# Patient Record
Sex: Female | Born: 1965 | Race: Black or African American | Hispanic: No | Marital: Married | Smoking: Current every day smoker
Health system: Southern US, Community
[De-identification: ages and names within clinical notes are randomized; demographics above are authoritative.]

## PROBLEM LIST (undated history)

## (undated) DIAGNOSIS — I10 Essential (primary) hypertension: Secondary | ICD-10-CM

## (undated) HISTORY — PX: ECTOPIC PREGNANCY SURGERY: SHX613

## (undated) HISTORY — PX: OTHER SURGICAL HISTORY: SHX169

---

## 1998-04-22 ENCOUNTER — Encounter: Payer: Self-pay | Admitting: Obstetrics & Gynecology

## 1998-04-22 ENCOUNTER — Inpatient Hospital Stay (HOSPITAL_COMMUNITY): Admission: AD | Admit: 1998-04-22 | Discharge: 1998-04-22 | Payer: Self-pay | Admitting: Obstetrics & Gynecology

## 1998-04-26 ENCOUNTER — Ambulatory Visit (HOSPITAL_COMMUNITY): Admission: RE | Admit: 1998-04-26 | Discharge: 1998-04-26 | Payer: Self-pay | Admitting: Obstetrics

## 1998-04-29 ENCOUNTER — Ambulatory Visit (HOSPITAL_COMMUNITY): Admission: RE | Admit: 1998-04-29 | Discharge: 1998-04-29 | Payer: Self-pay | Admitting: *Deleted

## 1998-05-04 ENCOUNTER — Inpatient Hospital Stay (HOSPITAL_COMMUNITY): Admission: AD | Admit: 1998-05-04 | Discharge: 1998-05-04 | Payer: Self-pay | Admitting: Obstetrics & Gynecology

## 1998-05-29 ENCOUNTER — Other Ambulatory Visit: Admission: RE | Admit: 1998-05-29 | Discharge: 1998-05-29 | Payer: Self-pay | Admitting: Obstetrics and Gynecology

## 2004-04-18 ENCOUNTER — Encounter: Admission: RE | Admit: 2004-04-18 | Discharge: 2004-04-18 | Payer: Self-pay | Admitting: Family Medicine

## 2004-04-23 ENCOUNTER — Encounter: Admission: RE | Admit: 2004-04-23 | Discharge: 2004-04-23 | Payer: Self-pay | Admitting: Neurology

## 2004-06-05 ENCOUNTER — Encounter: Admission: RE | Admit: 2004-06-05 | Discharge: 2004-09-03 | Payer: Self-pay | Admitting: Family Medicine

## 2005-07-09 ENCOUNTER — Emergency Department (HOSPITAL_COMMUNITY): Admission: EM | Admit: 2005-07-09 | Discharge: 2005-07-09 | Payer: Self-pay | Admitting: Family Medicine

## 2006-10-27 ENCOUNTER — Emergency Department (HOSPITAL_COMMUNITY): Admission: EM | Admit: 2006-10-27 | Discharge: 2006-10-27 | Payer: Self-pay | Admitting: Emergency Medicine

## 2007-07-14 ENCOUNTER — Inpatient Hospital Stay (HOSPITAL_COMMUNITY): Admission: AD | Admit: 2007-07-14 | Discharge: 2007-07-14 | Payer: Self-pay | Admitting: Obstetrics & Gynecology

## 2007-07-17 ENCOUNTER — Ambulatory Visit (HOSPITAL_COMMUNITY): Admission: AD | Admit: 2007-07-17 | Discharge: 2007-07-17 | Payer: Self-pay | Admitting: Obstetrics & Gynecology

## 2007-07-17 ENCOUNTER — Encounter: Payer: Self-pay | Admitting: Obstetrics & Gynecology

## 2007-07-17 ENCOUNTER — Ambulatory Visit: Payer: Self-pay | Admitting: Obstetrics & Gynecology

## 2007-08-05 ENCOUNTER — Ambulatory Visit: Payer: Self-pay | Admitting: Obstetrics and Gynecology

## 2008-02-02 ENCOUNTER — Encounter: Payer: Self-pay | Admitting: Gynecology

## 2008-02-02 ENCOUNTER — Ambulatory Visit: Payer: Self-pay | Admitting: Gynecology

## 2008-02-02 ENCOUNTER — Other Ambulatory Visit: Admission: RE | Admit: 2008-02-02 | Discharge: 2008-02-02 | Payer: Self-pay | Admitting: Gynecology

## 2009-07-24 ENCOUNTER — Encounter: Admission: RE | Admit: 2009-07-24 | Discharge: 2009-07-24 | Payer: Self-pay | Admitting: Family Medicine

## 2010-03-07 ENCOUNTER — Ambulatory Visit: Admit: 2010-03-07 | Payer: Self-pay | Admitting: Gynecology

## 2010-07-03 NOTE — Op Note (Signed)
Alejandra Sloan, ANDREAS NO.:  [id number]   MEDICAL RECORD NO.:  [id number]          PATIENT TYPE:  AMB   LOCATION:  [facility]                           FACILITY:  [facility]   PHYSICIAN:  Lesly Dukes, M.D. DATE OF BIRTH:  March 15, 1965   DATE OF PROCEDURE:  07/17/2007  DATE OF DISCHARGE:                               OPERATIVE REPORT   PREOPERATIVE DIAGNOSIS:  A 45 year old female with left ectopic  pregnancy and pain.   POSTOPERATIVE DIAGNOSIS:  A 45 year old female with left ectopic  pregnancy and pain.   PROCEDURE:  Laparoscopic left salpingectomy and removal of ectopic.   SURGEON:  Lesly Dukes, M.D.   ANESTHESIA:  General.   FINDINGS:  A 5-cm left ectopic pregnancy.  No blood in the pelvis.   SPECIMENS:  Left fallopian tube and pregnancy to pathology.   EBL:  Minimal.   COMPLICATIONS:  None   PROCEDURE:  After informed consent was obtained, the patient was taken  to the operating room where general anesthesia was induced.  The patient  was placed in dorsal lithotomy position, and prepared and draped in  normal sterile fashion.  The bladder was emptied with a catheter.  A  Hulka clip was placed on the cervix.  An infraumbilical skin incision  was made with a scalpel, and carried down to the fascia.  The fascia was  incised and extended bluntly.  The peritoneum was entered bluntly.  A  Hasson trocar was introduced and held in place with 0-Vicryl sutures.   Pneumoperitoneum was achieved with CO2.  The laparoscope was introduced.  Two 5 mm ports were placed on the right side of the abdomen under direct  visualization.  Using a blunt grasper, the left fallopian tube and  pregnancy was grasped.  Using the Gyrus cautery and sharp dissection in  a stepwise fashion, the left fallopian tube was removed from its blood  supply and freed up so that it could be from the abdomen.  We changed to  5 mm scope.  An Endopouch was used to remove the pregnancy through 10 mm  port at the umbilicus.  We switched back to the 10 mm scope, and there  was good hemostasis noted at the end of the procedure.  There was also  copious adhesions on the right fallopian tube.  The patient needs to use  birth control to prevent recurrence ectopic pregnancies given that she  has had several in the past.   The pneumoperitoneum was released, and the laparoscope and Hasson trocar  removed as a single unit.  The fascia at the umbilicus was closed with 0  Vicryl in a running fashion.  The skin of all ports were closed with 4-0  Vicryl in a  subcuticular fashion.  Dermabond was placed in the 5 mm port as well.  There was good hemostasis at the end of the procedure.  The Hulka clip  was removed from the cervix.  The patient tolerated the procedure well.  The sponge, lap, instrument, and needle count were correct x2.  The  patient went to recovery room  in stable condition.      Lesly Dukes, M.D.  Electronically Signed     KHL/MEDQ  D:  07/17/2007  T:  07/18/2007  Job:  [id number]

## 2010-11-14 LAB — CBC
MCHC: 33.4
MCV: 84.7
Platelets: 310
WBC: 8.8

## 2010-11-14 LAB — URINALYSIS, ROUTINE W REFLEX MICROSCOPIC
Bilirubin Urine: NEGATIVE
Hgb urine dipstick: NEGATIVE
Nitrite: NEGATIVE
Protein, ur: NEGATIVE
Urobilinogen, UA: 0.2

## 2010-11-14 LAB — HCG, QUANTITATIVE, PREGNANCY
hCG, Beta Chain, Quant, S: 10174 — ABNORMAL HIGH
hCG, Beta Chain, Quant, S: 8952 — ABNORMAL HIGH

## 2010-11-14 LAB — ABO/RH: ABO/RH(D): O POS

## 2011-09-05 ENCOUNTER — Emergency Department (HOSPITAL_COMMUNITY): Payer: 59

## 2011-09-05 ENCOUNTER — Emergency Department (HOSPITAL_COMMUNITY)
Admission: EM | Admit: 2011-09-05 | Discharge: 2011-09-05 | Disposition: A | Discharge status: Home / Self Care | Payer: 59 | Attending: Emergency Medicine | Admitting: Emergency Medicine

## 2011-09-05 ENCOUNTER — Encounter (HOSPITAL_COMMUNITY): Payer: Self-pay | Admitting: Emergency Medicine

## 2011-09-05 DIAGNOSIS — M549 Dorsalgia, unspecified: Secondary | ICD-10-CM | POA: Diagnosis present

## 2011-09-05 DIAGNOSIS — Y92410 Unspecified street and highway as the place of occurrence of the external cause: Secondary | ICD-10-CM | POA: Insufficient documentation

## 2011-09-05 DIAGNOSIS — S20219A Contusion of unspecified front wall of thorax, initial encounter: Secondary | ICD-10-CM

## 2011-09-05 DIAGNOSIS — M25569 Pain in unspecified knee: Secondary | ICD-10-CM | POA: Diagnosis not present

## 2011-09-05 DIAGNOSIS — S76019A Strain of muscle, fascia and tendon of unspecified hip, initial encounter: Secondary | ICD-10-CM

## 2011-09-05 DIAGNOSIS — IMO0002 Reserved for concepts with insufficient information to code with codable children: Secondary | ICD-10-CM | POA: Insufficient documentation

## 2011-09-05 DIAGNOSIS — M542 Cervicalgia: Secondary | ICD-10-CM | POA: Diagnosis not present

## 2011-09-05 DIAGNOSIS — F17200 Nicotine dependence, unspecified, uncomplicated: Secondary | ICD-10-CM | POA: Diagnosis not present

## 2011-09-05 DIAGNOSIS — I10 Essential (primary) hypertension: Secondary | ICD-10-CM | POA: Insufficient documentation

## 2011-09-05 DIAGNOSIS — K769 Liver disease, unspecified: Secondary | ICD-10-CM | POA: Insufficient documentation

## 2011-09-05 DIAGNOSIS — R160 Hepatomegaly, not elsewhere classified: Secondary | ICD-10-CM

## 2011-09-05 HISTORY — DX: Essential (primary) hypertension: I10

## 2011-09-05 LAB — CBC WITH DIFFERENTIAL/PLATELET
HCT: 38.6 % (ref 36.0–46.0)
Hemoglobin: 13 g/dL (ref 12.0–15.0)
Lymphocytes Relative: 32 % (ref 12–46)
Lymphs Abs: 2 10*3/uL (ref 0.7–4.0)
Monocytes Absolute: 0.5 10*3/uL (ref 0.1–1.0)
Monocytes Relative: 8 % (ref 3–12)
Neutro Abs: 3.4 10*3/uL (ref 1.7–7.7)
WBC: 6.3 10*3/uL (ref 4.0–10.5)

## 2011-09-05 LAB — POCT I-STAT, CHEM 8
BUN: 9 mg/dL (ref 6–23)
Calcium, Ion: 1.19 mmol/L (ref 1.12–1.23)
Chloride: 103 mEq/L (ref 96–112)
Creatinine, Ser: 1 mg/dL (ref 0.50–1.10)
Glucose, Bld: 88 mg/dL (ref 70–99)

## 2011-09-05 MED ORDER — ONDANSETRON HCL 4 MG/2ML IJ SOLN
4.0000 mg | Freq: Once | INTRAMUSCULAR | Status: AC
  Administered 2011-09-05: 4 mg via INTRAVENOUS
  Filled 2011-09-05: qty 2

## 2011-09-05 MED ORDER — IBUPROFEN 800 MG PO TABS: 800.0000 mg | ORAL_TABLET | Freq: Three times a day (TID) | ORAL | Status: AC

## 2011-09-05 MED ORDER — HYDROCODONE-ACETAMINOPHEN 5-325 MG PO TABS: 2.0000 | ORAL_TABLET | ORAL | Status: AC | PRN

## 2011-09-05 MED ORDER — HYDROMORPHONE HCL PF 1 MG/ML IJ SOLN
1.0000 mg | Freq: Once | INTRAMUSCULAR | Status: AC
  Administered 2011-09-05: 1 mg via INTRAVENOUS
  Filled 2011-09-05: qty 1

## 2011-09-05 MED ORDER — HYDROMORPHONE HCL PF 1 MG/ML IJ SOLN
0.5000 mg | Freq: Once | INTRAMUSCULAR | Status: DC
  Filled 2011-09-05: qty 1

## 2011-09-05 MED ORDER — DIAZEPAM 5 MG PO TABS
5.0000 mg | ORAL_TABLET | Freq: Once | ORAL | Status: AC
  Administered 2011-09-05: 5 mg via ORAL
  Filled 2011-09-05: qty 1

## 2011-09-05 MED ORDER — SODIUM CHLORIDE 0.9 % IV BOLUS (SEPSIS)
500.0000 mL | Freq: Once | INTRAVENOUS | Status: AC
  Administered 2011-09-05: 500 mL via INTRAVENOUS

## 2011-09-05 MED ORDER — DIAZEPAM 5 MG PO TABS: ORAL_TABLET | ORAL | Status: AC

## 2011-09-05 MED ORDER — HYDROMORPHONE HCL PF 1 MG/ML IJ SOLN
0.5000 mg | Freq: Once | INTRAMUSCULAR | Status: AC
  Administered 2011-09-05: 0.5 mg via INTRAVENOUS
  Filled 2011-09-05: qty 1

## 2011-09-05 MED ORDER — KETOROLAC TROMETHAMINE 30 MG/ML IJ SOLN
30.0000 mg | Freq: Once | INTRAMUSCULAR | Status: AC
  Administered 2011-09-05: 30 mg via INTRAVENOUS
  Filled 2011-09-05: qty 1

## 2011-09-05 MED ORDER — IOHEXOL 300 MG/ML  SOLN
80.0000 mL | Freq: Once | INTRAMUSCULAR | Status: AC | PRN
  Administered 2011-09-05: 80 mL via INTRAVENOUS

## 2011-09-05 NOTE — ED Notes (Signed)
Pt unable to provide urine sample at this time 
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
## 2011-09-05 NOTE — ED Notes (Signed)
Patient transported to X-ray 
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
## 2011-09-05 NOTE — ED Notes (Signed)
Pt discharged home with husband. Pt ambulated to discharge independently. A x 4. NAD

## 2011-09-05 NOTE — ED Notes (Addendum)
Pt transported to Xray. 

## 2011-09-06 NOTE — ED Provider Notes (Signed)
Medical screening examination/treatment/procedure(s) were performed by non-physician practitioner and as supervising physician I was immediately available for consultation/collaboration.   Celene Kras, MD 09/06/11 [phone]

## 2011-09-10 ENCOUNTER — Other Ambulatory Visit: Payer: Self-pay | Admitting: Family Medicine

## 2011-09-10 DIAGNOSIS — R109 Unspecified abdominal pain: Secondary | ICD-10-CM

## 2011-09-11 ENCOUNTER — Ambulatory Visit
Admission: RE | Admit: 2011-09-11 | Discharge: 2011-09-11 | Disposition: A | Discharge status: Home / Self Care | Payer: 59 | Source: Ambulatory Visit | Attending: Family Medicine | Admitting: Family Medicine

## 2011-09-11 DIAGNOSIS — R109 Unspecified abdominal pain: Secondary | ICD-10-CM

## 2011-09-11 MED ORDER — GADOXETATE DISODIUM 0.25 MMOL/ML IV SOLN
6.0000 mL | Freq: Once | INTRAVENOUS | Status: AC | PRN
  Administered 2011-09-11: 6 mL via INTRAVENOUS

## 2012-10-05 ENCOUNTER — Other Ambulatory Visit: Payer: Self-pay | Admitting: Orthopedic Surgery

## 2012-10-12 ENCOUNTER — Encounter (HOSPITAL_COMMUNITY): Payer: Self-pay | Admitting: Pharmacy Technician

## 2012-10-13 NOTE — Pre-Procedure Instructions (Signed)
JUDA LAJEUNESSE  10/13/2012   Your procedure is scheduled on:  10/21/2012   Report to [facility] at 7 AM.  Call this number if you have problems the morning of surgery: [phone]   Remember:   Do not eat food or drink liquids after midnight.   Take these medicines the morning of surgery with A SIP OF WATER: valium, plendil, hydrocodone   Do not wear jewelry, make-up or nail polish.  Do not wear lotions, powders, or perfumes. You may wear deodorant.  Do not shave 48 hours prior to surgery. Men may shave face and neck.  Do not bring valuables to the hospital.  [facility] is not responsible                   for any belongings or valuables.  Contacts, dentures or bridgework may not be worn into surgery.  Leave suitcase in the car. After surgery it may be brought to your room.  For patients admitted to the hospital, checkout time is 11:00 AM the day of  discharge.   Patients discharged the day of surgery will not be allowed to drive  home.  Name and phone number of your driver: family  Special Instructions: Incentive Spirometry - Practice and bring it with you on the day of surgery.   Please read over the following fact sheets that you were given: Pain Booklet, Coughing and Deep Breathing, Blood Transfusion Information, MRSA Information and Surgical Site Infection Prevention

## 2012-10-14 ENCOUNTER — Encounter (HOSPITAL_COMMUNITY): Payer: Self-pay

## 2012-10-14 ENCOUNTER — Encounter (HOSPITAL_COMMUNITY)
Admission: RE | Admit: 2012-10-14 | Discharge: 2012-10-14 | Disposition: A | Discharge status: Home / Self Care | Payer: 59 | Source: Ambulatory Visit | Attending: Orthopedic Surgery | Admitting: Orthopedic Surgery

## 2012-10-14 DIAGNOSIS — Z01818 Encounter for other preprocedural examination: Secondary | ICD-10-CM | POA: Insufficient documentation

## 2012-10-14 DIAGNOSIS — Z01810 Encounter for preprocedural cardiovascular examination: Secondary | ICD-10-CM | POA: Insufficient documentation

## 2012-10-14 DIAGNOSIS — Z01812 Encounter for preprocedural laboratory examination: Secondary | ICD-10-CM | POA: Insufficient documentation

## 2012-10-14 LAB — COMPREHENSIVE METABOLIC PANEL
Albumin: 3.8 g/dL (ref 3.5–5.2)
BUN: 9 mg/dL (ref 6–23)
Calcium: 9.1 mg/dL (ref 8.4–10.5)
Creatinine, Ser: 0.72 mg/dL (ref 0.50–1.10)
GFR calc Af Amer: >90 mL/min (ref >90)
Total Protein: 7.1 g/dL (ref 6.0–8.3)

## 2012-10-14 LAB — PROTIME-INR
INR: 1.01 (ref 0.00–1.49)
Prothrombin Time: 13.1 seconds (ref 11.6–15.2)

## 2012-10-14 LAB — TYPE AND SCREEN: Antibody Screen: NEGATIVE

## 2012-10-14 LAB — CBC WITH DIFFERENTIAL/PLATELET
Basophils Relative: 1 % (ref 0–1)
Eosinophils Absolute: 0.2 10*3/uL (ref 0.0–0.7)
Eosinophils Relative: 3 % (ref 0–5)
HCT: 39.8 % (ref 36.0–46.0)
Hemoglobin: 13 g/dL (ref 12.0–15.0)
MCH: 27.2 pg (ref 26.0–34.0)
MCHC: 32.7 g/dL (ref 30.0–36.0)
MCV: 83.3 fL (ref 78.0–100.0)
Monocytes Absolute: 0.5 10*3/uL (ref 0.1–1.0)
Monocytes Relative: 7 % (ref 3–12)
Neutrophils Relative %: 50 % (ref 43–77)

## 2012-10-14 LAB — URINALYSIS, ROUTINE W REFLEX MICROSCOPIC
Hgb urine dipstick: NEGATIVE
Ketones, ur: NEGATIVE mg/dL
Nitrite: NEGATIVE
Urobilinogen, UA: 0.2 mg/dL (ref 0.0–1.0)

## 2012-10-14 LAB — ABO/RH: ABO/RH(D): O POS

## 2012-10-14 LAB — SURGICAL PCR SCREEN: Staphylococcus aureus: NEGATIVE

## 2012-10-14 LAB — URINE MICROSCOPIC-ADD ON

## 2012-10-20 MED ORDER — CEFAZOLIN SODIUM-DEXTROSE 2-3 GM-% IV SOLR
2.0000 g | INTRAVENOUS | Status: AC
  Administered 2012-10-21: 2 g via INTRAVENOUS
  Filled 2012-10-20: qty 50

## 2012-10-21 ENCOUNTER — Encounter (HOSPITAL_COMMUNITY): Payer: Self-pay | Admitting: Anesthesiology

## 2012-10-21 ENCOUNTER — Ambulatory Visit (HOSPITAL_COMMUNITY)
Admission: RE | Admit: 2012-10-21 | Discharge: 2012-10-21 | Disposition: A | Discharge status: Home / Self Care | Payer: 59 | Source: Ambulatory Visit | Attending: Orthopedic Surgery | Admitting: Orthopedic Surgery

## 2012-10-21 ENCOUNTER — Encounter (HOSPITAL_COMMUNITY)
Admission: RE | Disposition: A | Discharge status: Home / Self Care | Payer: Self-pay | Source: Ambulatory Visit | Attending: Orthopedic Surgery

## 2012-10-21 ENCOUNTER — Ambulatory Visit (HOSPITAL_COMMUNITY): Payer: 59 | Admitting: Anesthesiology

## 2012-10-21 ENCOUNTER — Ambulatory Visit (HOSPITAL_COMMUNITY): Payer: 59

## 2012-10-21 ENCOUNTER — Encounter (HOSPITAL_COMMUNITY): Payer: Self-pay | Admitting: *Deleted

## 2012-10-21 DIAGNOSIS — M5126 Other intervertebral disc displacement, lumbar region: Secondary | ICD-10-CM | POA: Insufficient documentation

## 2012-10-21 DIAGNOSIS — I10 Essential (primary) hypertension: Secondary | ICD-10-CM | POA: Insufficient documentation

## 2012-10-21 DIAGNOSIS — Z79899 Other long term (current) drug therapy: Secondary | ICD-10-CM | POA: Insufficient documentation

## 2012-10-21 DIAGNOSIS — F17200 Nicotine dependence, unspecified, uncomplicated: Secondary | ICD-10-CM | POA: Insufficient documentation

## 2012-10-21 DIAGNOSIS — M5410 Radiculopathy, site unspecified: Secondary | ICD-10-CM

## 2012-10-21 HISTORY — PX: LUMBAR LAMINECTOMY/DECOMPRESSION MICRODISCECTOMY: SHX5026

## 2012-10-21 SURGERY — LUMBAR LAMINECTOMY/DECOMPRESSION MICRODISCECTOMY
Anesthesia: General | Site: Spine Lumbar | Laterality: Left | Wound class: Clean

## 2012-10-21 MED ORDER — METHYLPREDNISOLONE ACETATE 40 MG/ML IJ SUSP
INTRAMUSCULAR | Status: AC
  Filled 2012-10-21: qty 1

## 2012-10-21 MED ORDER — HYDROMORPHONE HCL PF 1 MG/ML IJ SOLN
INTRAMUSCULAR | Status: AC
  Filled 2012-10-21: qty 1

## 2012-10-21 MED ORDER — INDIGOTINDISULFONATE SODIUM 8 MG/ML IJ SOLN
INTRAMUSCULAR | Status: AC
  Filled 2012-10-21: qty 5

## 2012-10-21 MED ORDER — ROCURONIUM BROMIDE 100 MG/10ML IV SOLN
INTRAVENOUS | Status: DC | PRN
  Administered 2012-10-21: 50 mg via INTRAVENOUS

## 2012-10-21 MED ORDER — NEOSTIGMINE METHYLSULFATE 1 MG/ML IJ SOLN
INTRAMUSCULAR | Status: DC | PRN
  Administered 2012-10-21: 3 mg via INTRAVENOUS

## 2012-10-21 MED ORDER — LACTATED RINGERS IV SOLN
INTRAVENOUS | Status: DC
  Administered 2012-10-21 (×2): via INTRAVENOUS

## 2012-10-21 MED ORDER — ONDANSETRON HCL 4 MG/2ML IJ SOLN
INTRAMUSCULAR | Status: DC | PRN
  Administered 2012-10-21: 4 mg via INTRAVENOUS

## 2012-10-21 MED ORDER — POVIDONE-IODINE 7.5 % EX SOLN
Freq: Once | CUTANEOUS | Status: DC
  Filled 2012-10-21: qty 118

## 2012-10-21 MED ORDER — PHENYLEPHRINE HCL 10 MG/ML IJ SOLN
INTRAMUSCULAR | Status: DC | PRN
  Administered 2012-10-21: 40 ug via INTRAVENOUS
  Administered 2012-10-21: 80 ug via INTRAVENOUS
  Administered 2012-10-21: 40 ug via INTRAVENOUS
  Administered 2012-10-21: 80 ug via INTRAVENOUS

## 2012-10-21 MED ORDER — FENTANYL CITRATE 0.05 MG/ML IJ SOLN
INTRAMUSCULAR | Status: DC | PRN
  Administered 2012-10-21: 150 ug via INTRAVENOUS
  Administered 2012-10-21: 50 ug via INTRAVENOUS

## 2012-10-21 MED ORDER — METHYLPREDNISOLONE ACETATE 40 MG/ML IJ SUSP
INTRAMUSCULAR | Status: DC | PRN
  Administered 2012-10-21: 40 mg via INTRA_ARTICULAR

## 2012-10-21 MED ORDER — HYDROMORPHONE HCL PF 1 MG/ML IJ SOLN
0.2500 mg | INTRAMUSCULAR | Status: DC | PRN
  Administered 2012-10-21 (×3): 0.5 mg via INTRAVENOUS

## 2012-10-21 MED ORDER — PROPOFOL 10 MG/ML IV BOLUS
INTRAVENOUS | Status: DC | PRN
  Administered 2012-10-21: 180 mg via INTRAVENOUS

## 2012-10-21 MED ORDER — OXYCODONE-ACETAMINOPHEN 5-325 MG PO TABS
ORAL_TABLET | ORAL | Status: AC
  Filled 2012-10-21: qty 1

## 2012-10-21 MED ORDER — MIDAZOLAM HCL 5 MG/5ML IJ SOLN
INTRAMUSCULAR | Status: DC | PRN
  Administered 2012-10-21: 2 mg via INTRAVENOUS

## 2012-10-21 MED ORDER — GLYCOPYRROLATE 0.2 MG/ML IJ SOLN
INTRAMUSCULAR | Status: DC | PRN
  Administered 2012-10-21: 0.6 mg via INTRAVENOUS

## 2012-10-21 MED ORDER — BUPIVACAINE-EPINEPHRINE PF 0.25-1:200000 % IJ SOLN
INTRAMUSCULAR | Status: AC
  Filled 2012-10-21: qty 30

## 2012-10-21 MED ORDER — THROMBIN 20000 UNITS EX SOLR
CUTANEOUS | Status: AC
  Filled 2012-10-21: qty 20000

## 2012-10-21 MED ORDER — INDIGOTINDISULFONATE SODIUM 8 MG/ML IJ SOLN
INTRAMUSCULAR | Status: DC | PRN
  Administered 2012-10-21: .2 mL via INTRAVENOUS

## 2012-10-21 MED ORDER — LIDOCAINE HCL (CARDIAC) 20 MG/ML IV SOLN
INTRAVENOUS | Status: DC | PRN
  Administered 2012-10-21: 70 mg via INTRAVENOUS

## 2012-10-21 MED ORDER — OXYCODONE-ACETAMINOPHEN 5-325 MG PO TABS
2.0000 | ORAL_TABLET | Freq: Once | ORAL | Status: AC
  Administered 2012-10-21: 1 via ORAL

## 2012-10-21 MED ORDER — BUPIVACAINE-EPINEPHRINE 0.25% -1:200000 IJ SOLN
INTRAMUSCULAR | Status: DC | PRN
  Administered 2012-10-21: 14 mL

## 2012-10-21 MED ORDER — SODIUM CHLORIDE 0.9 % IV SOLN
10.0000 mg | INTRAVENOUS | Status: DC | PRN
  Administered 2012-10-21: 15 ug/min via INTRAVENOUS

## 2012-10-21 MED ORDER — THROMBIN 20000 UNITS EX SOLR
CUTANEOUS | Status: DC | PRN
  Administered 2012-10-21: 11:00:00 via TOPICAL

## 2012-10-21 SURGICAL SUPPLY — 72 items
APL SKNCLS STERI-STRIP NONHPOA (GAUZE/BANDAGES/DRESSINGS) ×1
BENZOIN TINCTURE PRP APPL 2/3 (GAUZE/BANDAGES/DRESSINGS) ×1 IMPLANT
BUR ROUND PRECISION 4.0 (BURR) ×2 IMPLANT
CANISTER SUCTION 2500CC (MISCELLANEOUS) ×2 IMPLANT
CARTRIDGE OIL MAESTRO DRILL (MISCELLANEOUS) ×1 IMPLANT
CLOTH BEACON ORANGE TIMEOUT ST (SAFETY) ×2 IMPLANT
CLSR STERI-STRIP ANTIMIC 1/2X4 (GAUZE/BANDAGES/DRESSINGS) ×1 IMPLANT
CORDS BIPOLAR (ELECTRODE) ×2 IMPLANT
COVER SURGICAL LIGHT HANDLE (MISCELLANEOUS) ×2 IMPLANT
DIFFUSER DRILL AIR PNEUMATIC (MISCELLANEOUS) ×2 IMPLANT
DRAIN CHANNEL 15F RND FF W/TCR (WOUND CARE) IMPLANT
DRAPE POUCH INSTRU U-SHP 10X18 (DRAPES) ×4 IMPLANT
DRAPE SURG 17X23 STRL (DRAPES) ×8 IMPLANT
DURAPREP 26ML APPLICATOR (WOUND CARE) ×2 IMPLANT
ELECT BLADE 4.0 EZ CLEAN MEGAD (MISCELLANEOUS)
ELECT CAUTERY BLADE 6.4 (BLADE) ×2 IMPLANT
ELECT REM PT RETURN 9FT ADLT (ELECTROSURGICAL) ×2
ELECTRODE BLDE 4.0 EZ CLN MEGD (MISCELLANEOUS) IMPLANT
ELECTRODE REM PT RTRN 9FT ADLT (ELECTROSURGICAL) ×1 IMPLANT
EVACUATOR SILICONE 100CC (DRAIN) IMPLANT
FILTER STRAW FLUID ASPIR (MISCELLANEOUS) ×2 IMPLANT
GAUZE SPONGE 4X4 16PLY XRAY LF (GAUZE/BANDAGES/DRESSINGS) ×4 IMPLANT
GLOVE BIO SURGEON STRL SZ7 (GLOVE) ×2 IMPLANT
GLOVE BIO SURGEON STRL SZ8 (GLOVE) ×2 IMPLANT
GLOVE BIOGEL PI IND STRL 7.0 (GLOVE) ×1 IMPLANT
GLOVE BIOGEL PI IND STRL 8 (GLOVE) ×1 IMPLANT
GLOVE BIOGEL PI INDICATOR 7.0 (GLOVE) ×1
GLOVE BIOGEL PI INDICATOR 8 (GLOVE) ×1
GOWN STRL NON-REIN LRG LVL3 (GOWN DISPOSABLE) ×4 IMPLANT
GOWN STRL REIN XL XLG (GOWN DISPOSABLE) ×2 IMPLANT
IV CATH 14GX2 1/4 (CATHETERS) ×2 IMPLANT
KIT BASIN OR (CUSTOM PROCEDURE TRAY) ×2 IMPLANT
KIT POSITION SURG JACKSON T1 (MISCELLANEOUS) ×2 IMPLANT
KIT ROOM TURNOVER OR (KITS) ×2 IMPLANT
NDL 18GX1X1/2 (RX/OR ONLY) (NEEDLE) ×1 IMPLANT
NDL HYPO 25GX1X1/2 BEV (NEEDLE) ×1 IMPLANT
NDL SPNL 18GX3.5 QUINCKE PK (NEEDLE) ×2 IMPLANT
NEEDLE 18GX1X1/2 (RX/OR ONLY) (NEEDLE) ×2 IMPLANT
NEEDLE 22X1 1/2 (OR ONLY) (NEEDLE) IMPLANT
NEEDLE HYPO 25GX1X1/2 BEV (NEEDLE) ×2 IMPLANT
NEEDLE SPNL 18GX3.5 QUINCKE PK (NEEDLE) ×4 IMPLANT
NS IRRIG 1000ML POUR BTL (IV SOLUTION) ×2 IMPLANT
OIL CARTRIDGE MAESTRO DRILL (MISCELLANEOUS) ×2
PACK LAMINECTOMY ORTHO (CUSTOM PROCEDURE TRAY) ×2 IMPLANT
PACK UNIVERSAL I (CUSTOM PROCEDURE TRAY) ×2 IMPLANT
PAD ARMBOARD 7.5X6 YLW CONV (MISCELLANEOUS) ×4 IMPLANT
PATTIES SURGICAL .5 X.5 (GAUZE/BANDAGES/DRESSINGS) IMPLANT
PATTIES SURGICAL .5 X1 (DISPOSABLE) ×2 IMPLANT
SPONGE GAUZE 4X4 12PLY (GAUZE/BANDAGES/DRESSINGS) ×2 IMPLANT
SPONGE INTESTINAL PEANUT (DISPOSABLE) ×3 IMPLANT
SPONGE SURGIFOAM ABS GEL 100 (HEMOSTASIS) ×2 IMPLANT
STRIP CLOSURE SKIN 1/2X4 (GAUZE/BANDAGES/DRESSINGS) IMPLANT
SURGIFLO TRUKIT (HEMOSTASIS) IMPLANT
SUT MNCRL AB 4-0 PS2 18 (SUTURE) ×2 IMPLANT
SUT VIC AB 0 CT1 18XCR BRD 8 (SUTURE) IMPLANT
SUT VIC AB 0 CT1 27 (SUTURE)
SUT VIC AB 0 CT1 27XBRD ANBCTR (SUTURE) IMPLANT
SUT VIC AB 0 CT1 8-18 (SUTURE)
SUT VIC AB 1 CT1 18XCR BRD 8 (SUTURE) ×1 IMPLANT
SUT VIC AB 1 CT1 8-18 (SUTURE) ×2
SUT VIC AB 2-0 CT2 18 VCP726D (SUTURE) ×2 IMPLANT
SYR 20CC LL (SYRINGE) IMPLANT
SYR 50ML SLIP (SYRINGE) ×1 IMPLANT
SYR BULB IRRIGATION 50ML (SYRINGE) ×2 IMPLANT
SYR CONTROL 10ML LL (SYRINGE) ×4 IMPLANT
SYR TB 1ML 26GX3/8 SAFETY (SYRINGE) ×4 IMPLANT
SYR TB 1ML LUER SLIP (SYRINGE) ×4 IMPLANT
TAPE CLOTH SURG 4X10 WHT LF (GAUZE/BANDAGES/DRESSINGS) ×1 IMPLANT
TOWEL OR 17X24 6PK STRL BLUE (TOWEL DISPOSABLE) ×2 IMPLANT
TOWEL OR 17X26 10 PK STRL BLUE (TOWEL DISPOSABLE) ×2 IMPLANT
WATER STERILE IRR 1000ML POUR (IV SOLUTION) ×1 IMPLANT
YANKAUER SUCT BULB TIP NO VENT (SUCTIONS) ×2 IMPLANT

## 2012-10-21 NOTE — Op Note (Signed)
Alejandra Sloan, Alejandra Sloan NO.:  [id number]  MEDICAL RECORD NO.:  [id number]  LOCATION:  [facility]                         FACILITY:  [facility]  PHYSICIAN:  Estill Bamberg, MD      DATE OF BIRTH:  [birth year]  DATE OF PROCEDURE:  10/21/2012 DATE OF DISCHARGE:  10/21/2012                              OPERATIVE REPORT   PREOPERATIVE DIAGNOSES: 1. Left-sided L5-S1 disk protrusion causing compression of the     traversing left S1 nerve. 2. Left-sided S1 radiculopathy.  POSTOPERATIVE DIAGNOSES: 1. Left-sided L5-S1 disk protrusion causing compression of the     traversing left S1 nerve. 2. Left-sided S1 radiculopathy.  PROCEDURE:  Left-sided L5-S1 microdiskectomy.  SURGEON:  Estill Bamberg, MD  ASSISTANT:  Jason Coop, PA-C  ANESTHESIA:  General endotracheal anesthesia.  COMPLICATIONS:  None.  DISPOSITION:  Stable.  ESTIMATED BLOOD LOSS:  Minimal.  INDICATIONS FOR PROCEDURE:  Briefly, Kamyiah is a very pleasant 47 year old female, who did initially presented to me about 1 year ago with low back and left leg pain.  The patient did get better with conservative care.  She did ultimately return to me about a year later with recurrence of pain in her low back.  She did also have pain in her left leg.  He and in the distribution of the S1 nerve on the left.  An MRI did reveal left-sided paracentral disk protrusion contacting the traversing S1 nerve on the left.  A repeat MRI was obtained which did reveal an increase in the size of the herniated disk fragment, and compression of the traversing S1 nerve.  The patient did have conservative care and did continue to have pain.  We therefore did discuss the risks and benefits regarding proceeding with a left-sided microdiskectomy at L5-S1.  The patient did fully understand the risks and limitations of the procedure as outlined in my preoperative note.  OPERATIVE DETAILS:  On October 21, 2012, the patient was brought  to surgery and general endotracheal anesthesia was administered.  The patient was placed prone on a well-padded flat Jackson bed with spinal frame.  Antibiotics were given and a time-out procedure was performed. The back was prepped and draped in the usual sterile fashion.  I then made a 1-inch incision overlying the L5-S1 interspace.  The lamina of L5 and S1 were subperiosteally exposed.  A lateral intraoperative radiograph did confirm the appropriate operative level.  I then used a high-speed bur to remove the medial and inferior aspect of the L5 lamina.  The ligamentum flavum was identified and the lateral aspect on the left side was removed.  The traversing S1 nerve was identified and retracted medially.  A broad-based L5-S1 disk protrusion was noted beneath the nerve.  I did use a 15-blade knife to perform an oblique annulotomy just lateral to the normal resting position of the traversing S1 nerve.  I then used an up-biting and a straight micro pituitary to remove the protruded disk fragment.  At the termination of this portion of the procedure, the previously noted protrusion was diminished and there was no compression identified of the traversing S1 nerve.  At this point, I did control epidural bleeding  using bipolar electrocautery in addition to Gelfoam and thrombin.  The wound was then copiously irrigated.  A 40 mg of Depo-Medrol was infiltrated about the epidural space on the left side, in the region of the traversing S1 nerve.  I then closed the fascia using #1 Vicryl.  The subcutaneous layer was then closed using 0 Vicryl followed by 2-0 Vicryl and the skin was closed using 3-0 Monocryl.  Benzoin and Steri-Strips were then applied followed by sterile dressing.  All instrument counts were correct at the termination of the procedure.  Of note, Jason Coop was my assistant throughout the entirety of the procedure and did aid in essential retraction and suctioning  needed throughout the entirety of the surgery.     Estill Bamberg, MD     MD/MEDQ  D:  10/21/2012  T:  10/21/2012  Job:  [id number]  cc:   Magnus Sinning) Tenny Craw, M.D.

## 2012-10-21 NOTE — Anesthesia Procedure Notes (Signed)
Procedure Name: Intubation Date/Time: 10/21/2012 10:33 AM Performed by: Quentin Ore Pre-anesthesia Checklist: Patient identified, Emergency Drugs available, Suction available, Patient being monitored and Timeout performed Patient Re-evaluated:Patient Re-evaluated prior to inductionOxygen Delivery Method: Circle system utilized Preoxygenation: Pre-oxygenation with 100% oxygen Intubation Type: IV induction Ventilation: Mask ventilation without difficulty Laryngoscope Size: Mac and 3 Grade View: Grade II Tube size: 7.0 mm Number of attempts: 1 Airway Equipment and Method: Stylet and LTA kit utilized Placement Confirmation: ETT inserted through vocal cords under direct vision,  positive ETCO2 and breath sounds checked- equal and bilateral Secured at: 22 cm Tube secured with: Tape Dental Injury: Teeth and Oropharynx as per pre-operative assessment

## 2012-10-21 NOTE — Transfer of Care (Signed)
Immediate Anesthesia Transfer of Care Note  Patient: Alejandra Sloan  Procedure(s) Performed: Procedure(s) with comments: LUMBAR LAMINECTOMY/DECOMPRESSION MICRODISCECTOMY (Left) - Left sided lumbar 5-sacrum 1 microdisectomy  Patient Location: PACU  Anesthesia Type:General  Level of Consciousness: awake, alert  and oriented  Airway & Oxygen Therapy: Patient Spontanous Breathing and Patient connected to nasal cannula oxygen  Post-op Assessment: Report given to PACU RN, Post -op Vital signs reviewed and stable and Patient moving all extremities X 4  Post vital signs: Reviewed and stable  Complications: No apparent anesthesia complications

## 2012-10-21 NOTE — Anesthesia Postprocedure Evaluation (Signed)
  Anesthesia Post-op Note  Patient: Alejandra Sloan  Procedure(s) Performed: Procedure(s) with comments: LUMBAR LAMINECTOMY/DECOMPRESSION MICRODISCECTOMY (Left) - Left sided lumbar 5-sacrum 1 microdisectomy  Patient Location: PACU  Anesthesia Type:General  Level of Consciousness: awake  Airway and Oxygen Therapy: Patient Spontanous Breathing  Post-op Pain: mild  Post-op Assessment: Post-op Vital signs reviewed  Post-op Vital Signs: Reviewed  Complications: No apparent anesthesia complications

## 2012-10-21 NOTE — Preoperative (Signed)
Beta Blockers   Reason not to administer Beta Blockers:Not Applicable 

## 2012-10-21 NOTE — Progress Notes (Signed)
Report given to Janann August for care for lunch relief

## 2012-10-21 NOTE — Anesthesia Preprocedure Evaluation (Signed)
Anesthesia Evaluation   Patient awake    Airway Mallampati: II      Dental   Pulmonary neg pulmonary ROS,          Cardiovascular hypertension, Pt. on medications     Neuro/Psych    GI/Hepatic negative GI ROS, Neg liver ROS,   Endo/Other  negative endocrine ROS  Renal/GU negative Renal ROS     Musculoskeletal   Abdominal   Peds  Hematology   Anesthesia Other Findings   Reproductive/Obstetrics                           Anesthesia Physical Anesthesia Plan  ASA: II  Anesthesia Plan: General   Post-op Pain Management:    Induction: Intravenous  Airway Management Planned: Oral ETT  Additional Equipment:   Intra-op Plan:   Post-operative Plan: Extubation in OR  Informed Consent: I have reviewed the patients History and Physical, chart, labs and discussed the procedure including the risks, benefits and alternatives for the proposed anesthesia with the patient or authorized representative who has indicated his/her understanding and acceptance.   Dental advisory given  Plan Discussed with: CRNA, Anesthesiologist and Surgeon  Anesthesia Plan Comments:         Anesthesia Quick Evaluation

## 2012-10-21 NOTE — H&P (Signed)
PREOPERATIVE H&P  Chief Complaint: left leg pain  HPI: Alejandra Sloan is a 47 y.o. female who presents ongoing pain in the left leg x 1 year. Patient has failed conservative care x 1 year and continues to have pain. MRI = left 5/1 HNP.  Past Medical History  Diagnosis Date  . Hypertension    Past Surgical History  Procedure Laterality Date  . Right knee surgery    . Ectopic pregnancy surgery     History   Social History  . Marital Status: Married    Spouse Name: N/A    Number of Children: N/A  . Years of Education: N/A   Social History Main Topics  . Smoking status: Current Every Day Smoker  . Smokeless tobacco: Not on file  . Alcohol Use: Yes  . Drug Use: No  . Sexual Activity: Not on file   Other Topics Concern  . Not on file   Social History Narrative  . No narrative on file   No family history on file. No Known Allergies Prior to Admission medications   Medication Sig Start Date End Date Taking? Authorizing Provider  Cholecalciferol (VITAMIN D-3) 5000 UNITS TABS Take 5,000 Units by mouth daily.   Yes Historical Provider, MD  diazepam (VALIUM) 5 MG tablet Take 5 mg by mouth daily as needed for anxiety.   Yes Historical Provider, MD  diclofenac (FLECTOR) 1.3 % PTCH Place 1 patch onto the skin daily as needed (pain).   Yes Historical Provider, MD  etodolac (LODINE) 400 MG tablet Take 400 mg by mouth 2 (two) times daily.   Yes Historical Provider, MD  felodipine (PLENDIL) 10 MG 24 hr tablet Take 10 mg by mouth daily.   Yes Historical Provider, MD  Ferrous Sulfate 28 MG TABS Take 28 mg by mouth daily.   Yes Historical Provider, MD  HYDROcodone-acetaminophen (NORCO) 7.5-325 MG per tablet Take 1 tablet by mouth 2 (two) times daily as needed for pain.   Yes Historical Provider, MD  Multiple Vitamin (MULTIVITAMIN WITH MINERALS) TABS Take 1 tablet by mouth daily.   Yes Historical Provider, MD  Multiple Vitamins-Minerals (HAIR/SKIN/NAILS PO) Take 1,000 mcg by mouth daily.    Yes Historical Provider, MD  traMADol (ULTRAM) 50 MG tablet Take 50 mg by mouth 2 (two) times daily as needed for pain.   Yes Historical Provider, MD     All other systems have been reviewed and were otherwise negative with the exception of those mentioned in the HPI and as above.  Physical Exam: Filed Vitals:   10/21/12 0816  BP: 144/99  Pulse: 73  Temp: 98.7 F (37.1 C)  Resp: 18    General: Alert, no acute distress Cardiovascular: No pedal edema Respiratory: No cyanosis, no use of accessory musculature GI: No organomegaly, abdomen is soft and non-tender Skin: No lesions in the area of chief complaint Neurologic: Sensation intact distally Psychiatric: Patient is competent for consent with normal mood and affect Lymphatic: No axillary or cervical lymphadenopathy  MUSCULOSKELETAL: + SLR on left  Assessment/Plan: Left leg pain Plan for Procedure(s): LUMBAR LAMINECTOMY/DECOMPRESSION MICRODISCECTOMY left L5/S1   Alejandra Hero, MD 10/21/2012 8:25 AM

## 2012-10-21 NOTE — Progress Notes (Signed)
Elyse Hsu visited patient

## 2012-10-22 ENCOUNTER — Encounter (HOSPITAL_COMMUNITY): Payer: Self-pay | Admitting: Orthopedic Surgery
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
## 2017-06-02 ENCOUNTER — Emergency Department (HOSPITAL_COMMUNITY)
Admission: EM | Admit: 2017-06-02 | Discharge: 2017-06-02 | Disposition: A | Discharge status: Home / Self Care | Payer: 59 | Attending: Emergency Medicine | Admitting: Emergency Medicine

## 2017-06-02 ENCOUNTER — Encounter (HOSPITAL_COMMUNITY): Payer: Self-pay | Admitting: Emergency Medicine

## 2017-06-02 ENCOUNTER — Emergency Department (HOSPITAL_COMMUNITY): Payer: 59

## 2017-06-02 ENCOUNTER — Other Ambulatory Visit: Payer: Self-pay

## 2017-06-02 DIAGNOSIS — R079 Chest pain, unspecified: Secondary | ICD-10-CM | POA: Diagnosis not present

## 2017-06-02 DIAGNOSIS — R51 Headache: Secondary | ICD-10-CM | POA: Insufficient documentation

## 2017-06-02 DIAGNOSIS — Z5321 Procedure and treatment not carried out due to patient leaving prior to being seen by health care provider: Secondary | ICD-10-CM | POA: Insufficient documentation

## 2017-06-02 LAB — BASIC METABOLIC PANEL
ANION GAP: 10 (ref 5–15)
BUN: 5 mg/dL — ABNORMAL LOW (ref 6–20)
CALCIUM: 9.1 mg/dL (ref 8.9–10.3)
CO2: 22 mmol/L (ref 22–32)
CREATININE: 0.85 mg/dL (ref 0.44–1.00)
Chloride: 106 mmol/L (ref 101–111)
Glucose, Bld: 91 mg/dL (ref 65–99)
Potassium: 3.5 mmol/L (ref 3.5–5.1)
SODIUM: 138 mmol/L (ref 135–145)

## 2017-06-02 LAB — I-STAT TROPONIN, ED: TROPONIN I, POC: 0 ng/mL (ref 0.00–0.08)

## 2017-06-02 LAB — CBC
HCT: 39.5 % (ref 36.0–46.0)
HEMOGLOBIN: 12.9 g/dL (ref 12.0–15.0)
MCH: 27.6 pg (ref 26.0–34.0)
MCHC: 32.7 g/dL (ref 30.0–36.0)
MCV: 84.4 fL (ref 78.0–100.0)
PLATELETS: 374 10*3/uL (ref 150–400)
RBC: 4.68 MIL/uL (ref 3.87–5.11)
RDW: 15.3 % (ref 11.5–15.5)
WBC: 8.5 10*3/uL (ref 4.0–10.5)

## 2017-06-02 LAB — I-STAT BETA HCG BLOOD, ED (MC, WL, AP ONLY): I-stat hCG, quantitative: <5 m[IU]/mL (ref <5)

## 2017-06-02 NOTE — ED Triage Notes (Signed)
Pt states she has had a headache since Thursday and 2 days ago she ago started having tightness in her chest. Pt appears in no distress. Pt denies any sob currently, pain is also intermittent.

## 2017-06-05 ENCOUNTER — Emergency Department (HOSPITAL_COMMUNITY)
Admission: EM | Admit: 2017-06-05 | Discharge: 2017-06-06 | Disposition: A | Discharge status: Home / Self Care | Payer: 59 | Attending: Emergency Medicine | Admitting: Emergency Medicine

## 2017-06-05 ENCOUNTER — Encounter (HOSPITAL_COMMUNITY): Payer: Self-pay | Admitting: Emergency Medicine

## 2017-06-05 ENCOUNTER — Emergency Department (HOSPITAL_COMMUNITY): Payer: 59

## 2017-06-05 DIAGNOSIS — Z79899 Other long term (current) drug therapy: Secondary | ICD-10-CM | POA: Diagnosis not present

## 2017-06-05 DIAGNOSIS — F17210 Nicotine dependence, cigarettes, uncomplicated: Secondary | ICD-10-CM | POA: Diagnosis not present

## 2017-06-05 DIAGNOSIS — H538 Other visual disturbances: Secondary | ICD-10-CM | POA: Insufficient documentation

## 2017-06-05 DIAGNOSIS — R42 Dizziness and giddiness: Secondary | ICD-10-CM | POA: Insufficient documentation

## 2017-06-05 DIAGNOSIS — R51 Headache: Secondary | ICD-10-CM | POA: Insufficient documentation

## 2017-06-05 DIAGNOSIS — I10 Essential (primary) hypertension: Secondary | ICD-10-CM | POA: Insufficient documentation

## 2017-06-05 DIAGNOSIS — Z823 Family history of stroke: Secondary | ICD-10-CM | POA: Diagnosis not present

## 2017-06-05 DIAGNOSIS — G4485 Primary stabbing headache: Secondary | ICD-10-CM

## 2017-06-05 DIAGNOSIS — R202 Paresthesia of skin: Secondary | ICD-10-CM | POA: Diagnosis not present

## 2017-06-05 LAB — DIFFERENTIAL
Basophils Absolute: 0.1 10*3/uL (ref 0.0–0.1)
Basophils Relative: 1 %
EOS ABS: 0.3 10*3/uL (ref 0.0–0.7)
EOS PCT: 3 %
LYMPHS ABS: 3.9 10*3/uL (ref 0.7–4.0)
Lymphocytes Relative: 43 %
MONO ABS: 0.6 10*3/uL (ref 0.1–1.0)
Monocytes Relative: 7 %
NEUTROS PCT: 46 %
Neutro Abs: 4.2 10*3/uL (ref 1.7–7.7)

## 2017-06-05 LAB — COMPREHENSIVE METABOLIC PANEL
ALBUMIN: 3.5 g/dL (ref 3.5–5.0)
ALT: 19 U/L (ref 14–54)
ANION GAP: 9 (ref 5–15)
AST: 20 U/L (ref 15–41)
Alkaline Phosphatase: 48 U/L (ref 38–126)
BILIRUBIN TOTAL: 0.2 mg/dL — AB (ref 0.3–1.2)
BUN: 10 mg/dL (ref 6–20)
CO2: 25 mmol/L (ref 22–32)
Calcium: 8.9 mg/dL (ref 8.9–10.3)
Chloride: 106 mmol/L (ref 101–111)
Creatinine, Ser: 0.99 mg/dL (ref 0.44–1.00)
GFR calc non Af Amer: >60 mL/min (ref >60)
GLUCOSE: 97 mg/dL (ref 65–99)
POTASSIUM: 4 mmol/L (ref 3.5–5.1)
Sodium: 140 mmol/L (ref 135–145)
TOTAL PROTEIN: 6.3 g/dL — AB (ref 6.5–8.1)

## 2017-06-05 LAB — APTT: aPTT: 29 seconds (ref 24–36)

## 2017-06-05 LAB — I-STAT CHEM 8, ED
BUN: 12 mg/dL (ref 6–20)
Calcium, Ion: 1.16 mmol/L (ref 1.15–1.40)
Chloride: 104 mmol/L (ref 101–111)
Creatinine, Ser: 1.1 mg/dL — ABNORMAL HIGH (ref 0.44–1.00)
Glucose, Bld: 94 mg/dL (ref 65–99)
HEMATOCRIT: 38 % (ref 36.0–46.0)
HEMOGLOBIN: 12.9 g/dL (ref 12.0–15.0)
Potassium: 3.9 mmol/L (ref 3.5–5.1)
SODIUM: 141 mmol/L (ref 135–145)
TCO2: 27 mmol/L (ref 22–32)

## 2017-06-05 LAB — PROTIME-INR
INR: 1.02
PROTHROMBIN TIME: 13.3 s (ref 11.4–15.2)

## 2017-06-05 LAB — CBC
HCT: 38.2 % (ref 36.0–46.0)
HEMOGLOBIN: 12.2 g/dL (ref 12.0–15.0)
MCH: 27.2 pg (ref 26.0–34.0)
MCHC: 31.9 g/dL (ref 30.0–36.0)
MCV: 85.3 fL (ref 78.0–100.0)
PLATELETS: 357 10*3/uL (ref 150–400)
RBC: 4.48 MIL/uL (ref 3.87–5.11)
RDW: 15.2 % (ref 11.5–15.5)
WBC: 9.1 10*3/uL (ref 4.0–10.5)

## 2017-06-05 LAB — I-STAT BETA HCG BLOOD, ED (MC, WL, AP ONLY)

## 2017-06-05 LAB — I-STAT TROPONIN, ED: TROPONIN I, POC: 0 ng/mL (ref 0.00–0.08)

## 2017-06-05 NOTE — ED Triage Notes (Signed)
Pt presents with headache ongoing for 1 wk; came to this ER Monday but left d/t long wait time; seen by PCP today who sent her here for further workup (concern for aneurysm); pt reports headache is L posterior, sharp, stabbing pains that are intermittent; new decreased sensation to L cheek/face and blurred vision; pt denies weakness or speech difficulty

## 2017-06-06 ENCOUNTER — Emergency Department (HOSPITAL_COMMUNITY): Payer: 59

## 2017-06-06 MED ORDER — MELATONIN 5 MG PO TABS: 5.0000 mg | ORAL_TABLET | Freq: Every day | ORAL | 0 refills | Status: AC

## 2017-06-06 MED ORDER — INDOMETHACIN 50 MG PO CAPS: 50.0000 mg | ORAL_CAPSULE | Freq: Two times a day (BID) | ORAL | 0 refills | Status: AC

## 2017-06-06 MED ORDER — IOPAMIDOL (ISOVUE-370) INJECTION 76%
50.0000 mL | Freq: Once | INTRAVENOUS | Status: AC
  Administered 2017-06-06: 50 mL via INTRAVENOUS

## 2017-06-06 MED ORDER — IOPAMIDOL (ISOVUE-370) INJECTION 76%
INTRAVENOUS | Status: AC
  Filled 2017-06-06: qty 50

## 2017-06-06 NOTE — ED Provider Notes (Signed)
[facility] EMERGENCY DEPARTMENT Provider Note   CSN: [id number] Arrival date & time: 06/05/17  1959     History   Chief Complaint Chief Complaint  Patient presents with  . Headache  . Numbness    HPI Alejandra Sloan is a 52 y.o. female.  HPI   52 year old female with a history of hypertension presents with concern for stabbing headache.  Reports that she has had left-sided stabbing headaches over the last 3-4 days.  They are worse with laughing and exertion.  Reports that the stabbing will come on for a second then will improve.  Reports over the last few days it has been more severe, with approximately 4 episodes of stabbing pain in hour.  Reports at this time since 5:30 in the morning she has not had any other episodes.  Reports his episodes are at times accompanied by in addition to brief stabbing headache, will have brief sensation of feeling off balance, and blurred vision in the left eye.  Denies visual field cuts or double vision.  Reports at one point she had had also a brief episode of tingling to the left face that resolved with resolution of headache.  Denies weakness, difficulty walking, difficulty talking.  Reports her cousin had a history of aneurysm, and her sister and other family member had hx of CVA. Sister was 40.  She does smoke cigarettes and has htn, no other risk factors.   Past Medical History:  Diagnosis Date  . Hypertension     There are no active problems to display for this patient.   Past Surgical History:  Procedure Laterality Date  . ECTOPIC PREGNANCY SURGERY    . LUMBAR LAMINECTOMY/DECOMPRESSION MICRODISCECTOMY Left 10/21/2012   Procedure: LUMBAR LAMINECTOMY/DECOMPRESSION MICRODISCECTOMY;  Surgeon: Emilee Hero, MD;  Location: [facility] OR;  Service: Orthopedics;  Laterality: Left;  Left sided lumbar 5-sacrum 1 microdisectomy  . right knee surgery       OB History   None      Home Medications    Prior to Admission  medications   Medication Sig Start Date End Date Taking? Authorizing Provider  Cholecalciferol (VITAMIN D-3) 5000 UNITS TABS Take 5,000 Units by mouth daily.    [provider]  diazepam (VALIUM) 5 MG tablet Take 5 mg by mouth daily as needed for anxiety.    [provider]  felodipine (PLENDIL) 10 MG 24 hr tablet Take 10 mg by mouth daily.    [provider]  Ferrous Sulfate 28 MG TABS Take 28 mg by mouth daily.    [provider]  indomethacin (INDOCIN) 50 MG capsule Take 1 capsule (50 mg total) by mouth 2 (two) times daily with a meal. 06/06/17   Alvira Monday, MD  Melatonin 5 MG TABS Take 1 tablet (5 mg total) by mouth at bedtime. 06/06/17   Alvira Monday, MD  Multiple Vitamin (MULTIVITAMIN WITH MINERALS) TABS Take 1 tablet by mouth daily.    [provider]  Multiple Vitamins-Minerals (HAIR/SKIN/NAILS PO) Take 1,000 mcg by mouth daily.    [provider]    Family History No family history on file.  Social History Social History   Tobacco Use  . Smoking status: Current Every Day Smoker  Substance Use Topics  . Alcohol use: Yes  . Drug use: No     Allergies   Patient has no known allergies.   Review of Systems Review of Systems  Constitutional: Negative for fever.  HENT: Negative for sore  throat.   Eyes: Positive for visual disturbance.  Respiratory: Negative for cough and shortness of breath.   Cardiovascular: Negative for chest pain.  Gastrointestinal: Negative for abdominal pain, nausea and vomiting.  Genitourinary: Negative for difficulty urinating.  Musculoskeletal: Negative for back pain and neck pain.  Skin: Negative for rash.  Neurological: Positive for dizziness and headaches. Negative for syncope, facial asymmetry, speech difficulty and weakness.     Physical Exam Updated Vital Signs BP (!) 142/90   Pulse 71   Temp 98.3 F (36.8 C) (Oral)   Resp 16   Ht 5\' 3"  (1.6 m)   Wt 62.1 kg (137 lb)    LMP 05/12/2017   SpO2 100%   BMI 24.27 kg/m   Physical Exam  Constitutional: She is oriented to person, place, and time. She appears well-developed and well-nourished. No distress.  HENT:  Head: Normocephalic and atraumatic.  Eyes: Conjunctivae and EOM are normal.  Neck: Normal range of motion.  Cardiovascular: Normal rate, regular rhythm, normal heart sounds and intact distal pulses. Exam reveals no gallop and no friction rub.  No murmur heard. Pulmonary/Chest: Effort normal and breath sounds normal. No respiratory distress. She has no wheezes. She has no rales.  Abdominal: Soft. She exhibits no distension. There is no tenderness. There is no guarding.  Musculoskeletal: She exhibits no edema or tenderness.  Neurological: She is alert and oriented to person, place, and time. She has normal strength. No cranial nerve deficit or sensory deficit. Coordination normal. GCS eye subscore is 4. GCS verbal subscore is 5. GCS motor subscore is 6.  Normal visual fields, normal EOM  Skin: Skin is warm and dry. No rash noted. She is not diaphoretic. No erythema.  Nursing note and vitals reviewed.    ED Treatments / Results  Labs (all labs ordered are listed, but only abnormal results are displayed) Labs Reviewed  COMPREHENSIVE METABOLIC PANEL - Abnormal; Notable for the following components:      Result Value   Total Protein 6.3 (*)    Total Bilirubin 0.2 (*)    All other components within normal limits  I-STAT CHEM 8, ED - Abnormal; Notable for the following components:   Creatinine, Ser 1.10 (*)    All other components within normal limits  PROTIME-INR  APTT  CBC  DIFFERENTIAL  I-STAT TROPONIN, ED  I-STAT BETA HCG BLOOD, ED ([facility], [facility], [facility] ONLY)    EKG None  Radiology Ct Angio Head W Or Wo Contrast  Result Date: 06/06/2017 CLINICAL DATA:  Headache for 2 days.  Normal neuro exam. EXAM: CT ANGIOGRAPHY HEAD AND NECK TECHNIQUE: Multidetector CT imaging of the head and neck was performed  using the standard protocol during bolus administration of intravenous contrast. Multiplanar CT image reconstructions and MIPs were obtained to evaluate the vascular anatomy. Carotid stenosis measurements (when applicable) are obtained utilizing NASCET criteria, using the distal internal carotid diameter as the denominator. CONTRAST:  50mL ISOVUE-370 IOPAMIDOL (ISOVUE-370) INJECTION 76% COMPARISON:  CT head without contrast 06/05/2017 FINDINGS: CT HEAD FINDINGS Brain: No acute infarct, hemorrhage, or mass lesion is present. The ventricles are of normal size. No significant extraaxial fluid collection is present. No significant white matter disease is present. Brainstem and cerebellum are normal. Vascular: No hyperdense vessel or unexpected calcification. Skull: Calvarium is intact. No focal lytic or blastic lesions are present. Sinuses: The paranasal sinuses and mastoid air cells are clear. Orbits: Globes and orbits are unremarkable. Review of the MIP images confirms the above findings CTA NECK  FINDINGS Aortic arch: There is a common origin of the left common carotid artery in the innominate artery. Atherosclerotic changes are present at the origin of the left subclavian artery. There is no significant stenosis. Atherosclerotic changes are present in the more distal aorta without aneurysm. Right carotid system: The right common carotid artery is within normal limits. Bifurcation is unremarkable. Mild tortuosity is present in the cervical right ICA without significant stenosis. Left carotid system: The left common carotid artery is within normal limits. Bifurcation is unremarkable. Cervical left ICA is mildly tortuous without focal stenosis. Vertebral arteries: The vertebral arteries both originate from the subclavian arteries. The right vertebral artery is the dominant vessel. There is no significant stenosis of either vertebral artery in the neck. Skeleton: Vertebral body heights alignment are maintained. Reversal  of normal cervical lordosis is likely positional. Other neck: Soft tissues the neck are otherwise unremarkable. No focal mucosal or submucosal lesions are present. There is mild heterogeneity of the thyroid. Upper chest: Lung apices are clear.  Thoracic inlet is unremarkable. Review of the MIP images confirms the above findings CTA HEAD FINDINGS Anterior circulation: The internal carotid arteries are within normal limits through the ICA termini bilaterally. A1 and M1 segments are normal. MCA bifurcations are intact. ACA and MCA branch vessels are within normal limits. Posterior circulation: The right vertebral artery is the dominant vessel. PICA origins are visualized and normal. The vertebrobasilar junction is. Both posterior cerebral arteries originate from basilar tip. PCA branch vessels are normal. Venous sinuses: The dural sinuses are patent. The straight sinus and deep cerebral veins are intact. Cortical veins are unremarkable. Left transverse sinus is dominant. Anatomic variants: None Delayed phase: The postcontrast images demonstrate no pathologic enhancement. Review of the MIP images confirms the above findings IMPRESSION: 1. Atherosclerotic changes at the aortic arch and origin of the left subclavian artery without stenosis or aneurysm. Aortic Atherosclerosis (ICD10-I70.0). 2. Tortuosity of the cervical internal carotid arteries bilaterally without focal stenosis. This is nonspecific, but most commonly seen in the setting of hypertension. 3. No significant stenosis or aneurysm to account for headaches. Electronically Signed   By: Marin Roberts M.D.   On: 06/06/2017 10:13   Ct Head Wo Contrast  Result Date: 06/05/2017 CLINICAL DATA:  Ongoing headache for 1 week. History of hypertension. EXAM: CT HEAD WITHOUT CONTRAST TECHNIQUE: Contiguous axial images were obtained from the base of the skull through the vertex without intravenous contrast. COMPARISON:  MRI of the head April 18, 2004 FINDINGS:  BRAIN: No intraparenchymal hemorrhage, mass effect nor midline shift. The ventricles and sulci are normal. No acute large vascular territory infarcts. No abnormal extra-axial fluid collections. Basal cisterns are patent. Multiple dural calcifications including falls and RIGHT frontal dura without mass effect. VASCULAR: Unremarkable. SKULL/SOFT TISSUES: No skull fracture. Fluid-filled non expanded sella. No significant soft tissue swelling. ORBITS/SINUSES: The included ocular globes and orbital contents are normal.The mastoid aircells and included paranasal sinuses are well-aerated. OTHER: None. IMPRESSION: Negative noncontrast CT HEAD. Electronically Signed   By: Awilda Metro M.D.   On: 06/05/2017 22:58   Ct Angio Neck W And/or Wo Contrast  Result Date: 06/06/2017 CLINICAL DATA:  Headache for 2 days.  Normal neuro exam. EXAM: CT ANGIOGRAPHY HEAD AND NECK TECHNIQUE: Multidetector CT imaging of the head and neck was performed using the standard protocol during bolus administration of intravenous contrast. Multiplanar CT image reconstructions and MIPs were obtained to evaluate the vascular anatomy. Carotid stenosis measurements (when applicable) are obtained utilizing NASCET criteria,  using the distal internal carotid diameter as the denominator. CONTRAST:  50mL ISOVUE-370 IOPAMIDOL (ISOVUE-370) INJECTION 76% COMPARISON:  CT head without contrast 06/05/2017 FINDINGS: CT HEAD FINDINGS Brain: No acute infarct, hemorrhage, or mass lesion is present. The ventricles are of normal size. No significant extraaxial fluid collection is present. No significant white matter disease is present. Brainstem and cerebellum are normal. Vascular: No hyperdense vessel or unexpected calcification. Skull: Calvarium is intact. No focal lytic or blastic lesions are present. Sinuses: The paranasal sinuses and mastoid air cells are clear. Orbits: Globes and orbits are unremarkable. Review of the MIP images confirms the above findings  CTA NECK FINDINGS Aortic arch: There is a common origin of the left common carotid artery in the innominate artery. Atherosclerotic changes are present at the origin of the left subclavian artery. There is no significant stenosis. Atherosclerotic changes are present in the more distal aorta without aneurysm. Right carotid system: The right common carotid artery is within normal limits. Bifurcation is unremarkable. Mild tortuosity is present in the cervical right ICA without significant stenosis. Left carotid system: The left common carotid artery is within normal limits. Bifurcation is unremarkable. Cervical left ICA is mildly tortuous without focal stenosis. Vertebral arteries: The vertebral arteries both originate from the subclavian arteries. The right vertebral artery is the dominant vessel. There is no significant stenosis of either vertebral artery in the neck. Skeleton: Vertebral body heights alignment are maintained. Reversal of normal cervical lordosis is likely positional. Other neck: Soft tissues the neck are otherwise unremarkable. No focal mucosal or submucosal lesions are present. There is mild heterogeneity of the thyroid. Upper chest: Lung apices are clear.  Thoracic inlet is unremarkable. Review of the MIP images confirms the above findings CTA HEAD FINDINGS Anterior circulation: The internal carotid arteries are within normal limits through the ICA termini bilaterally. A1 and M1 segments are normal. MCA bifurcations are intact. ACA and MCA branch vessels are within normal limits. Posterior circulation: The right vertebral artery is the dominant vessel. PICA origins are visualized and normal. The vertebrobasilar junction is. Both posterior cerebral arteries originate from basilar tip. PCA branch vessels are normal. Venous sinuses: The dural sinuses are patent. The straight sinus and deep cerebral veins are intact. Cortical veins are unremarkable. Left transverse sinus is dominant. Anatomic variants:  None Delayed phase: The postcontrast images demonstrate no pathologic enhancement. Review of the MIP images confirms the above findings IMPRESSION: 1. Atherosclerotic changes at the aortic arch and origin of the left subclavian artery without stenosis or aneurysm. Aortic Atherosclerosis (ICD10-I70.0). 2. Tortuosity of the cervical internal carotid arteries bilaterally without focal stenosis. This is nonspecific, but most commonly seen in the setting of hypertension. 3. No significant stenosis or aneurysm to account for headaches. Electronically Signed   By: Marin Robertshristopher  Mattern M.D.   On: 06/06/2017 10:13    Procedures Procedures (including critical care time)  Medications Ordered in ED Medications  iopamidol (ISOVUE-370) 76 % injection 50 mL (50 mLs Intravenous Contrast Given 06/06/17 0936)     Initial Impression / Assessment and Plan / ED Course  I have reviewed the triage vital signs and the nursing notes.  Pertinent labs & imaging results that were available during my care of the patient were reviewed by me and considered in my medical decision making (see chart for details).     52 year old female with a history of hypertension presents with concern for stabbing headache.  CT head without acute abnormalities.  Giving stabbing nature of pain, with associated  neurologic changes with headache, including feeling off balance and blurred vision, CT Angio of head and neck was done to evaluate for signs of large vessel abnormalities and aneurysm.  CT angios shows no stenosis or aneuysm, does show tortuosity of cervical internal carotid arteries, possibly secondary to hypertension, and atherosclerosis.  Given neurologic symptoms are lasting only seconds and only brought on by brief stabbing headaches and she has normal neurologic exam, with frequent intermittent headaches/neuro symptoms for days, do not suspect TIAs. Recommend ASA given atherosclerosis and recommend follow up with PCP, possible  outpatient MRI to evaluate for other nonemergent lesion.  Given history, strongly suspect primary stabbing headache. Will initiate indomethacin and melatonin and recommend PCP and Neuro follow up.   Final Clinical Impressions(s) / ED Diagnoses   Final diagnoses:  Stabbing headache    ED Discharge Orders        Ordered    indomethacin (INDOCIN) 50 MG capsule  2 times daily with meals     06/06/17 1042    Melatonin 5 MG TABS  Daily at bedtime     06/06/17 1042       Alvira Monday, MD 06/06/17 2125

## 2018-05-21 ENCOUNTER — Other Ambulatory Visit: Payer: Self-pay | Admitting: Physician Assistant

## 2018-05-21 DIAGNOSIS — Z1231 Encounter for screening mammogram for malignant neoplasm of breast: Secondary | ICD-10-CM

## 2018-07-20 ENCOUNTER — Ambulatory Visit: Payer: 59

## 2019-01-16 ENCOUNTER — Ambulatory Visit (INDEPENDENT_AMBULATORY_CARE_PROVIDER_SITE_OTHER): Payer: 59

## 2019-01-16 ENCOUNTER — Other Ambulatory Visit: Payer: Self-pay

## 2019-01-16 ENCOUNTER — Ambulatory Visit (HOSPITAL_COMMUNITY)
Admission: EM | Admit: 2019-01-16 | Discharge: 2019-01-16 | Disposition: A | Discharge status: Home / Self Care | Payer: 59 | Attending: Physician Assistant | Admitting: Physician Assistant

## 2019-01-16 ENCOUNTER — Encounter (HOSPITAL_COMMUNITY): Payer: Self-pay

## 2019-01-16 DIAGNOSIS — S39012A Strain of muscle, fascia and tendon of lower back, initial encounter: Secondary | ICD-10-CM

## 2019-01-16 DIAGNOSIS — M5432 Sciatica, left side: Secondary | ICD-10-CM

## 2019-01-16 MED ORDER — KETOROLAC TROMETHAMINE 60 MG/2ML IM SOLN
INTRAMUSCULAR | Status: AC
  Filled 2019-01-16: qty 2

## 2019-01-16 MED ORDER — PREDNISONE 10 MG (21) PO TBPK: ORAL_TABLET | ORAL | 0 refills | Status: AC

## 2019-01-16 MED ORDER — CYCLOBENZAPRINE HCL 5 MG PO TABS: 5.0000 mg | ORAL_TABLET | Freq: Three times a day (TID) | ORAL | 0 refills | Status: AC | PRN

## 2019-01-16 MED ORDER — KETOROLAC TROMETHAMINE 60 MG/2ML IM SOLN
60.0000 mg | Freq: Once | INTRAMUSCULAR | Status: AC
  Administered 2019-01-16: 60 mg via INTRAMUSCULAR
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
## 2019-01-16 NOTE — Discharge Instructions (Addendum)
Ice and heat back 15 minutes 4 times per day. Stretch back daily. Follow up with PCP Go to ER with worsening symptoms.

## 2019-04-25 DIAGNOSIS — H6091 Unspecified otitis externa, right ear: Secondary | ICD-10-CM | POA: Diagnosis not present

## 2019-04-25 DIAGNOSIS — R590 Localized enlarged lymph nodes: Secondary | ICD-10-CM | POA: Diagnosis not present

## 2019-04-25 IMAGING — DX DG CHEST 2V
2 series · 2 of 2 positions shown · non-contrast
Comparison: 10/14/2012

CLINICAL DATA: Chest pain and shortness of breath.

EXAM:
CHEST - 2 VIEW

[chest pa]
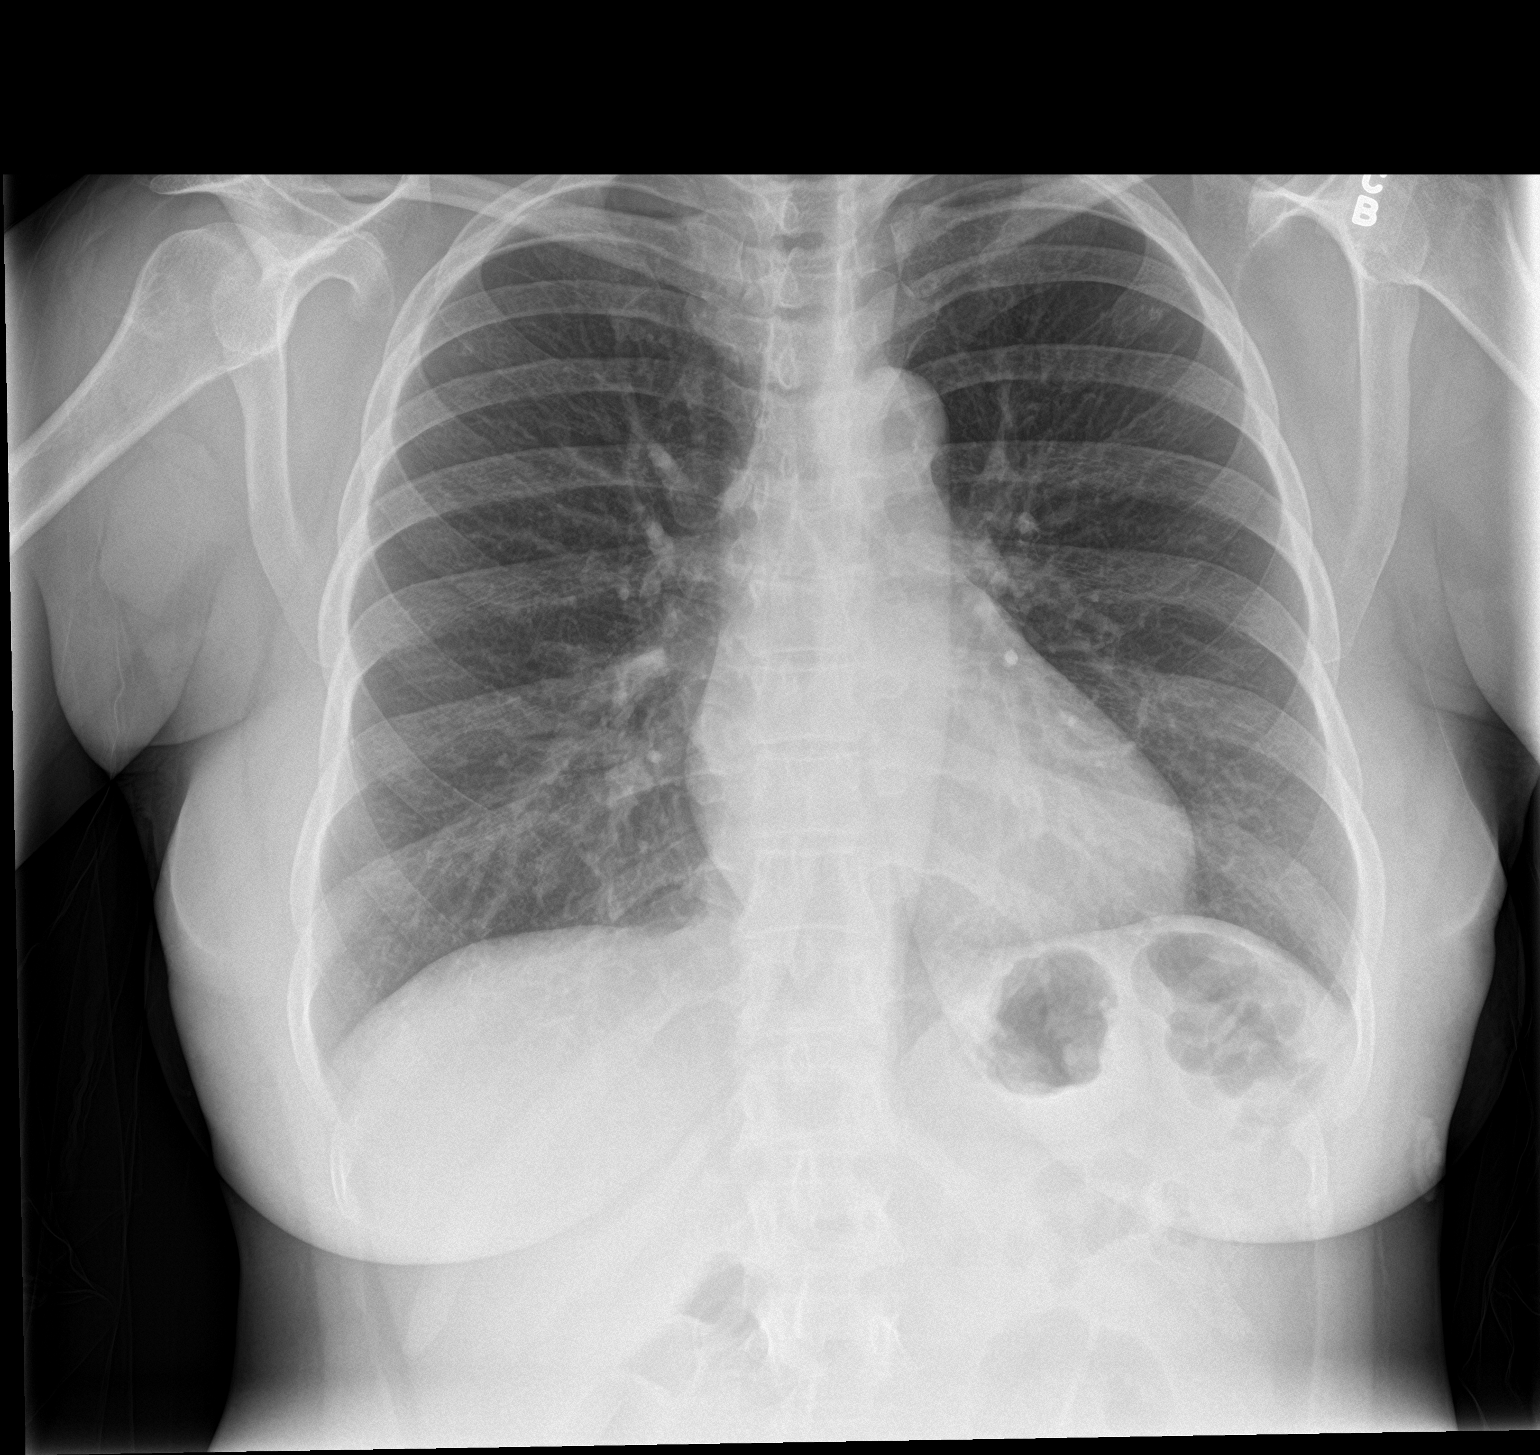

[chest lat]
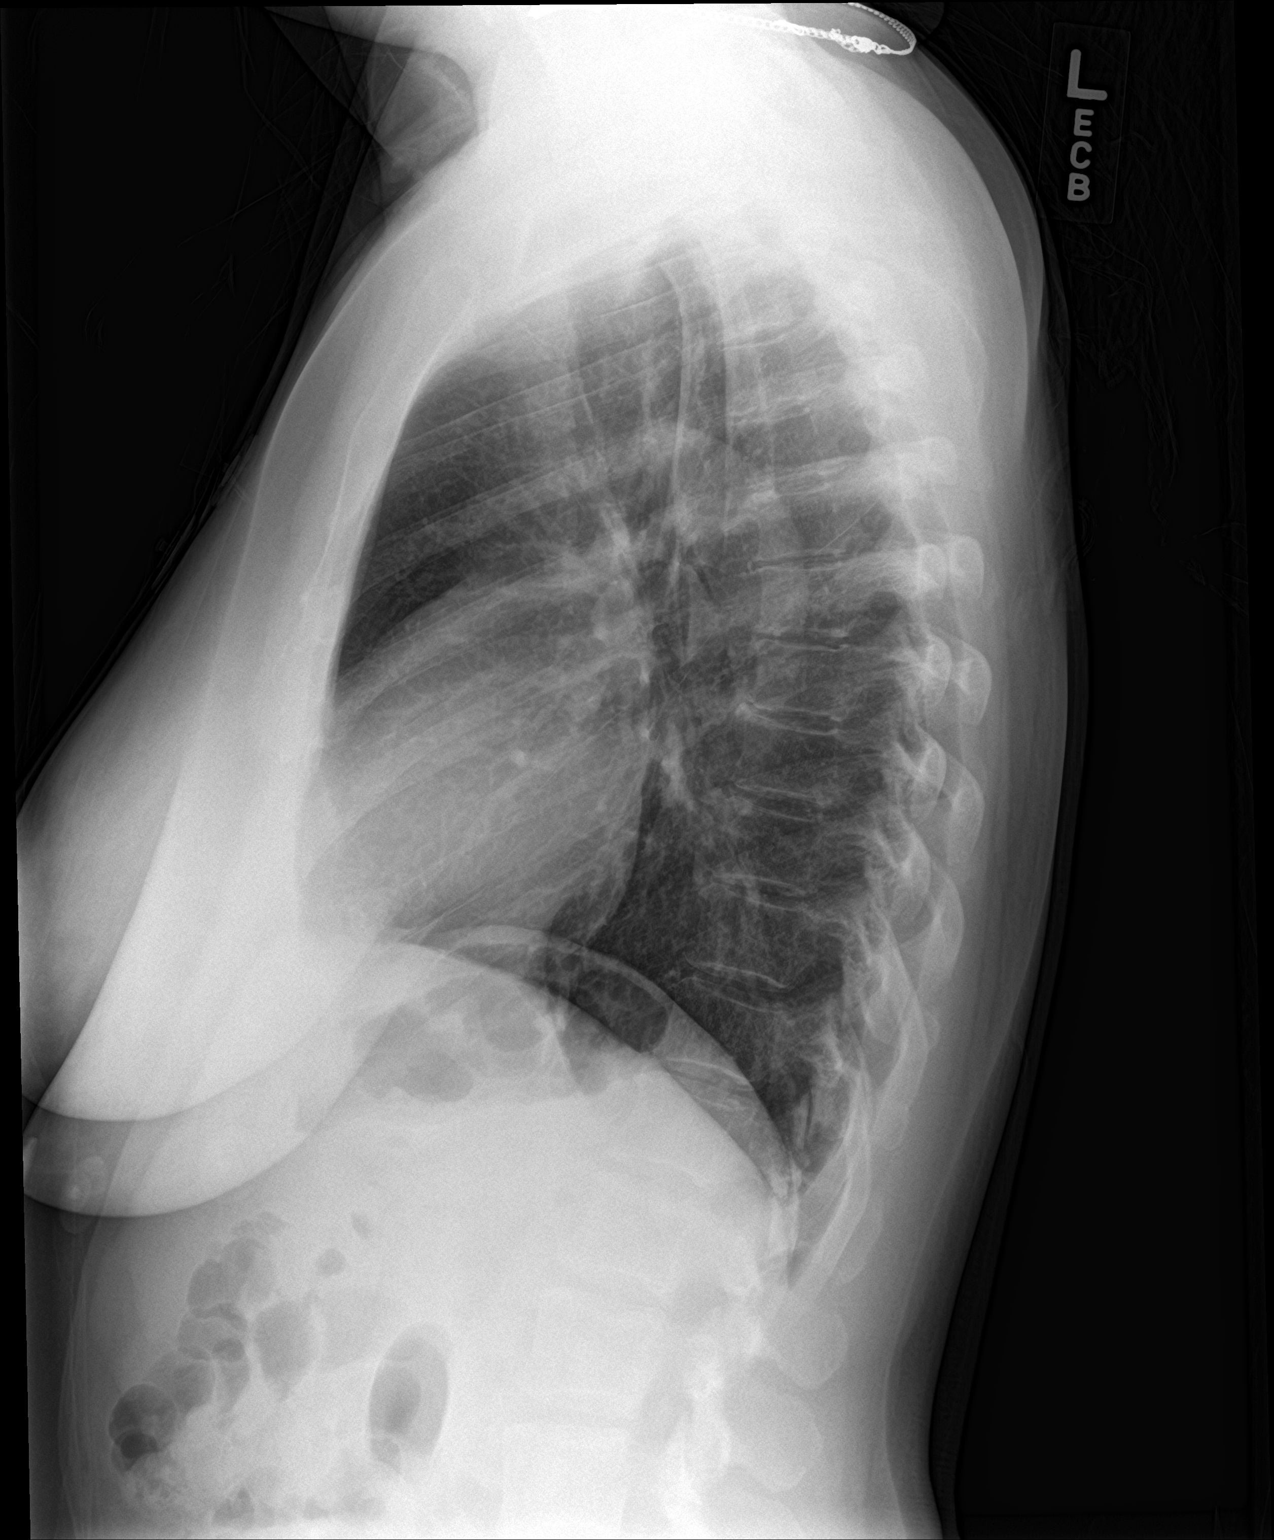

[2 of 2 positions shown; findings below may reference images not displayed]

FINDINGS: The lungs are clear without focal pneumonia, edema, pneumothorax or
pleural effusion. The cardiopericardial silhouette is within normal
limits for size. The visualized bony structures of the thorax are
intact. Nodular density/densities projecting over the lungs are
compatible with pads for telemetry leads.
IMPRESSION: No active cardiopulmonary disease.

## 2019-06-08 DIAGNOSIS — F4321 Adjustment disorder with depressed mood: Secondary | ICD-10-CM | POA: Diagnosis not present

## 2019-07-06 DIAGNOSIS — F4321 Adjustment disorder with depressed mood: Secondary | ICD-10-CM | POA: Diagnosis not present

## 2019-07-06 DIAGNOSIS — F331 Major depressive disorder, recurrent, moderate: Secondary | ICD-10-CM | POA: Diagnosis not present

## 2019-07-06 DIAGNOSIS — F410 Panic disorder [episodic paroxysmal anxiety] without agoraphobia: Secondary | ICD-10-CM | POA: Diagnosis not present

## 2019-08-11 DIAGNOSIS — F410 Panic disorder [episodic paroxysmal anxiety] without agoraphobia: Secondary | ICD-10-CM | POA: Diagnosis not present

## 2019-08-11 DIAGNOSIS — F4322 Adjustment disorder with anxiety: Secondary | ICD-10-CM | POA: Diagnosis not present

## 2019-08-26 DIAGNOSIS — F4322 Adjustment disorder with anxiety: Secondary | ICD-10-CM | POA: Diagnosis not present

## 2019-08-31 DIAGNOSIS — H6982 Other specified disorders of Eustachian tube, left ear: Secondary | ICD-10-CM | POA: Diagnosis not present

## 2019-08-31 DIAGNOSIS — H9202 Otalgia, left ear: Secondary | ICD-10-CM | POA: Diagnosis not present

## 2020-04-10 DIAGNOSIS — N926 Irregular menstruation, unspecified: Secondary | ICD-10-CM | POA: Diagnosis not present

## 2020-04-10 DIAGNOSIS — F17200 Nicotine dependence, unspecified, uncomplicated: Secondary | ICD-10-CM | POA: Diagnosis not present

## 2020-04-10 DIAGNOSIS — I10 Essential (primary) hypertension: Secondary | ICD-10-CM | POA: Diagnosis not present

## 2020-06-20 DIAGNOSIS — N926 Irregular menstruation, unspecified: Secondary | ICD-10-CM | POA: Diagnosis not present

## 2020-06-20 DIAGNOSIS — I10 Essential (primary) hypertension: Secondary | ICD-10-CM | POA: Diagnosis not present

## 2020-11-30 DIAGNOSIS — S60551A Superficial foreign body of right hand, initial encounter: Secondary | ICD-10-CM | POA: Diagnosis not present

## 2020-12-19 DIAGNOSIS — J0100 Acute maxillary sinusitis, unspecified: Secondary | ICD-10-CM | POA: Diagnosis not present

## 2020-12-19 DIAGNOSIS — H1032 Unspecified acute conjunctivitis, left eye: Secondary | ICD-10-CM | POA: Diagnosis not present

## 2021-11-01 ENCOUNTER — Encounter: Payer: Self-pay | Admitting: Cardiovascular Disease

## 2021-11-01 NOTE — Progress Notes (Unsigned)
Cardiology Office Note:    Date:  11/02/2021   ID:  Alejandra Sloan, DOB January 15, 1966, MRN [id number]  PCP:  Moshe Cipro, NP   [facility] Providers Cardiologist:  Harriet Bollen     Referring MD: Daisy Floro, MD   Chief Complaint  Patient presents with   Hypertension         History of Present Illness:    Alejandra Sloan is a 56 y.o. female with a hx of HTN We are asked to see her for further evaluation of her  leg swelling  + family hx of CAD Sister had CABG at 9 Grandmother had MI.  Hx of HTN for 20 years .  Has limited her salt,  takes.  No CP  Has occasional palpitations Occasional heart burn  30 min walk with her dog every day   Smokes 4-5 cigarettes a day  Last labs are from [facility]  Will get a coronary  calcium score     Past Medical History:  Diagnosis Date   Hypertension     Past Surgical History:  Procedure Laterality Date   ECTOPIC PREGNANCY SURGERY     LUMBAR LAMINECTOMY/DECOMPRESSION MICRODISCECTOMY Left 10/21/2012   Procedure: LUMBAR LAMINECTOMY/DECOMPRESSION MICRODISCECTOMY;  Surgeon: Emilee Hero, MD;  Location: [facility] OR;  Service: Orthopedics;  Laterality: Left;  Left sided lumbar 5-sacrum 1 microdisectomy   right knee surgery      Current Medications: Current Meds  Medication Sig   Cholecalciferol (VITAMIN D-3) 5000 UNITS TABS Take 5,000 Units by mouth daily.   diazepam (VALIUM) 5 MG tablet Take 5 mg by mouth daily as needed for anxiety.   diltiazem (DILACOR XR) 180 MG 24 hr capsule Take 1 capsule by mouth daily.   Ferrous Sulfate 28 MG TABS Take 28 mg by mouth daily.   hydrochlorothiazide (HYDRODIURIL) 12.5 MG tablet Take 12.5 mg by mouth daily.   Multiple Vitamin (MULTIVITAMIN WITH MINERALS) TABS Take 1 tablet by mouth daily.   Multiple Vitamins-Minerals (HAIR/SKIN/NAILS PO) Take 1,000 mcg by mouth daily.     Allergies:   Patient has no known allergies.   Social History   Socioeconomic History   Marital  status: Married    Spouse name: Not on file   Number of children: Not on file   Years of education: Not on file   Highest education level: Not on file  Occupational History   Not on file  Tobacco Use   Smoking status: Every Day   Smokeless tobacco: Not on file  Substance and Sexual Activity   Alcohol use: Yes   Drug use: No   Sexual activity: Not on file  Other Topics Concern   Not on file  Social History Narrative   Not on file   Social Determinants of Health   Financial Resource Strain: Not on file  Food Insecurity: Not on file  Transportation Needs: Not on file  Physical Activity: Not on file  Stress: Not on file  Social Connections: Not on file     Family History: The patient's family history is not on file.  ROS:   Please see the history of present illness.     All other systems reviewed and are negative.  EKGs/Labs/Other Studies Reviewed:    The following studies were reviewed today:   EKG: November 02, 2021: Normal sinus rhythm at 82.  Low voltage.  Nonspecific ST and T wave changes.  Recent Labs: No results found for requested labs within last 365 days.  Recent Lipid Panel No results found for: "CHOL", "TRIG", "HDL", "CHOLHDL", "VLDL", "LDLCALC", "LDLDIRECT"   Risk Assessment/Calculations:                Physical Exam:    VS:  BP 134/84   Pulse 82   Ht 5\' 3"  (1.6 m)   Wt 149 lb 12.8 oz (67.9 kg)   SpO2 97%   BMI 26.54 kg/m     Wt Readings from Last 3 Encounters:  11/02/21 149 lb 12.8 oz (67.9 kg)  06/05/17 137 lb (62.1 kg)  10/14/12 132 lb 3.2 oz (60 kg)     GEN:  Well nourished, well developed in no acute distress HEENT: Normal NECK: No JVD; No carotid bruits LYMPHATICS: No lymphadenopathy CARDIAC: RRR, no murmurs, rubs, gallops RESPIRATORY:  Clear to auscultation without rales, wheezing or rhonchi  ABDOMEN: Soft, non-tender, non-distended MUSCULOSKELETAL:  No edema; No deformity  SKIN: Warm and dry NEUROLOGIC:  Alert and  oriented x 3 PSYCHIATRIC:  Normal affect   ASSESSMENT:    1. Family history of early CAD   2. Hyperlipidemia, unspecified hyperlipidemia type    PLAN:       Hyperlipidemia: Alejandra Sloan has a strong family history of premature coronary artery disease.  She is a smoker.  We will get a coronary calcium score.  I encouraged her to stop smoking.  We will see her again in 1 year.  2.  Hypertension: Blood pressure is well controlled continue current medications.           Medication Adjustments/Labs and Tests Ordered: Current medicines are reviewed at length with the patient today.  Concerns regarding medicines are outlined above.  Orders Placed This Encounter  Procedures   CT CARDIAC SCORING (SELF PAY ONLY)   EKG 12-Lead   No orders of the defined types were placed in this encounter.   Patient Instructions  Medication Instructions:  Your physician recommends that you continue on your current medications as directed. Please refer to the Current Medication list given to you today.  *If you need a refill on your cardiac medications before your next appointment, please call your pharmacy*  Lab Work: If you have labs (blood work) drawn today and your tests are completely normal, you will receive your results only by: MyChart Message (if you have MyChart) OR A paper copy in the mail If you have any lab test that is abnormal or we need to change your treatment, we will call you to review the results.  Testing/Procedures: Cardiac CT scanning for calcium score, (CAT scanning), is a noninvasive, special x-ray that produces cross-sectional images of the body using x-rays and a computer. CT scans help physicians diagnose and treat medical conditions. For some CT exams, a contrast material is used to enhance visibility in the area of the body being studied. CT scans provide greater clarity and reveal more details than regular x-ray exams.  Follow-Up: At [facility], you and your  health needs are our priority.  As part of our continuing mission to provide you with exceptional heart care, we have created designated Provider Care Teams.  These Care Teams include your primary Cardiologist (physician) and Advanced Practice Providers (APPs -  Physician Assistants and Nurse Practitioners) who all work together to provide you with the care you need, when you need it.  We recommend signing up for the patient portal called "MyChart".  Sign up information is provided on this After Visit Summary.  MyChart is used to connect with patients  for Virtual Visits (Telemedicine).  Patients are able to view lab/test results, encounter notes, upcoming appointments, etc.  Non-urgent messages can be sent to your provider as well.   To learn more about what you can do with MyChart, go to [email or web address].    Your next appointment:   1 year(s)  The format for your next appointment:   In Person  Provider:   Kristeen Miss, MD      Important Information About Sugar         Signed, Kristeen Miss, MD  11/02/2021 6:18 PM    [facility]

## 2021-11-02 ENCOUNTER — Encounter: Payer: Self-pay | Admitting: Cardiovascular Disease

## 2021-11-02 ENCOUNTER — Ambulatory Visit
Payer: No Typology Code available for payment source | Attending: Cardiovascular Disease | Admitting: Cardiovascular Disease

## 2021-11-02 VITALS — BP 134/84 | HR 82 | Ht 63.0 in | Wt 149.8 lb

## 2021-11-02 DIAGNOSIS — E785 Hyperlipidemia, unspecified: Secondary | ICD-10-CM | POA: Diagnosis not present

## 2021-11-02 DIAGNOSIS — Z8249 Family history of ischemic heart disease and other diseases of the circulatory system: Secondary | ICD-10-CM

## 2021-11-02 NOTE — Patient Instructions (Signed)
Medication Instructions:  Your physician recommends that you continue on your current medications as directed. Please refer to the Current Medication list given to you today.  *If you need a refill on your cardiac medications before your next appointment, please call your pharmacy*  Lab Work: If you have labs (blood work) drawn today and your tests are completely normal, you will receive your results only by: MyChart Message (if you have MyChart) OR A paper copy in the mail If you have any lab test that is abnormal or we need to change your treatment, we will call you to review the results.  Testing/Procedures: Cardiac CT scanning for calcium score, (CAT scanning), is a noninvasive, special x-ray that produces cross-sectional images of the body using x-rays and a computer. CT scans help physicians diagnose and treat medical conditions. For some CT exams, a contrast material is used to enhance visibility in the area of the body being studied. CT scans provide greater clarity and reveal more details than regular x-ray exams.  Follow-Up: At [facility], you and your health needs are our priority.  As part of our continuing mission to provide you with exceptional heart care, we have created designated Provider Care Teams.  These Care Teams include your primary Cardiologist (physician) and Advanced Practice Providers (APPs -  Physician Assistants and Nurse Practitioners) who all work together to provide you with the care you need, when you need it.  We recommend signing up for the patient portal called "MyChart".  Sign up information is provided on this After Visit Summary.  MyChart is used to connect with patients for Virtual Visits (Telemedicine).  Patients are able to view lab/test results, encounter notes, upcoming appointments, etc.  Non-urgent messages can be sent to your provider as well.   To learn more about what you can do with MyChart, go to [email or web address].    Your next  appointment:   1 year(s)  The format for your next appointment:   In Person  Provider:   Kristeen Miss, MD      Important Information About Sugar

## 2021-12-05 ENCOUNTER — Ambulatory Visit (HOSPITAL_BASED_OUTPATIENT_CLINIC_OR_DEPARTMENT_OTHER): Payer: No Typology Code available for payment source | Attending: Cardiovascular Disease

## 2023-05-26 ENCOUNTER — Encounter: Payer: Self-pay | Admitting: Cardiovascular Disease

## 2023-05-26 NOTE — Progress Notes (Deleted)
 Cardiology Office Note:    Date:  05/26/2023   ID:  Alejandra Sloan, DOB 09/08/1965, MRN [id number]  PCP:  No primary care provider on file.   [facility] Providers Cardiologist:  Katilin Raynes     Referring MD: No ref. provider found   No chief complaint on file.   History of Present Illness:    Alejandra Sloan is a 58 y.o. female with a hx of HTN We are asked to see her for further evaluation of her  leg swelling  + family hx of CAD Sister had CABG at 62 Grandmother had MI.  Hx of HTN for 20 years .  Has limited her salt,  takes.  No CP  Has occasional palpitations Occasional heart burn  30 min walk with her dog every day   Smokes 4-5 cigarettes a day  Last labs are from [facility]  Will get a coronary  calcium score   May 27, 2023: Alejandra Sloan is seen for follow-up of her hypertension and family history of coronary artery disease.  Past Medical History:  Diagnosis Date   Hypertension     Past Surgical History:  Procedure Laterality Date   ECTOPIC PREGNANCY SURGERY     LUMBAR LAMINECTOMY/DECOMPRESSION MICRODISCECTOMY Left 10/21/2012   Procedure: LUMBAR LAMINECTOMY/DECOMPRESSION MICRODISCECTOMY;  Surgeon: Emilee Hero, MD;  Location: [facility] OR;  Service: Orthopedics;  Laterality: Left;  Left sided lumbar 5-sacrum 1 microdisectomy   right knee surgery      Current Medications: No outpatient medications have been marked as taking for the 05/27/23 encounter (Appointment) with Zasha Belleau, Deloris Ping, MD.     Allergies:   Patient has no known allergies.   Social History   Socioeconomic History   Marital status: Married    Spouse name: Not on file   Number of children: Not on file   Years of education: Not on file   Highest education level: Not on file  Occupational History   Not on file  Tobacco Use   Smoking status: Every Day   Smokeless tobacco: Not on file  Substance and Sexual Activity   Alcohol use: Yes   Drug use: No   Sexual activity: Not on file   Other Topics Concern   Not on file  Social History Narrative   Not on file   Social Drivers of Health   Financial Resource Strain: Not on file  Food Insecurity: Not on file  Transportation Needs: Not on file  Physical Activity: Not on file  Stress: Not on file  Social Connections: Not on file     Family History: The patient's family history is not on file.  ROS:   Please see the history of present illness.     All other systems reviewed and are negative.  EKGs/Labs/Other Studies Reviewed:    The following studies were reviewed today:   EKG:         Recent Labs: No results found for requested labs within last 365 days.  Recent Lipid Panel No results found for: "CHOL", "TRIG", "HDL", "CHOLHDL", "VLDL", "LDLCALC", "LDLDIRECT"   Risk Assessment/Calculations:      Physical Exam:    Physical Exam: There were no vitals taken for this visit.  No BP recorded.  {Refresh Note OR Click here to enter BP  :1}***    GEN:  Well nourished, well developed in no acute distress HEENT: Normal NECK: No JVD; No carotid bruits LYMPHATICS: No lymphadenopathy CARDIAC: RRR ***, no murmurs, rubs, gallops RESPIRATORY:  Clear  to auscultation without rales, wheezing or rhonchi  ABDOMEN: Soft, non-tender, non-distended MUSCULOSKELETAL:  No edema; No deformity  SKIN: Warm and dry NEUROLOGIC:  Alert and oriented x 3   ASSESSMENT:    No diagnosis found.  PLAN:       Hyperlipidemia:    2.  Hypertension:            Medication Adjustments/Labs and Tests Ordered: Current medicines are reviewed at length with the patient today.  Concerns regarding medicines are outlined above.  No orders of the defined types were placed in this encounter.  No orders of the defined types were placed in this encounter.   There are no Patient Instructions on file for this visit.   Signed, Kristeen Miss, MD  05/26/2023 6:09 PM    [facility]

## 2023-05-27 ENCOUNTER — Ambulatory Visit: Attending: Cardiology | Admitting: Cardiovascular Disease

## 2023-05-30 ENCOUNTER — Encounter: Payer: Self-pay | Admitting: Cardiovascular Disease
